# Patient Record
Sex: Female | Born: 1955 | Race: Black or African American | Hispanic: No | Marital: Married | Smoking: Never smoker
Health system: Southern US, Community
[De-identification: ages and names within clinical notes are randomized; demographics above are authoritative.]

## PROBLEM LIST (undated history)

## (undated) DIAGNOSIS — D219 Benign neoplasm of connective and other soft tissue, unspecified: Secondary | ICD-10-CM

## (undated) DIAGNOSIS — J45909 Unspecified asthma, uncomplicated: Secondary | ICD-10-CM

## (undated) DIAGNOSIS — B999 Unspecified infectious disease: Secondary | ICD-10-CM

## (undated) DIAGNOSIS — C801 Malignant (primary) neoplasm, unspecified: Secondary | ICD-10-CM

## (undated) DIAGNOSIS — H9192 Unspecified hearing loss, left ear: Secondary | ICD-10-CM

## (undated) HISTORY — PX: LAPAROTOMY: SHX154

## (undated) HISTORY — PX: ABDOMINAL HYSTERECTOMY: SHX81

---

## 2011-11-19 ENCOUNTER — Encounter (HOSPITAL_COMMUNITY): Payer: Self-pay | Admitting: *Deleted

## 2011-11-19 ENCOUNTER — Inpatient Hospital Stay (HOSPITAL_COMMUNITY)
Admission: AD | Admit: 2011-11-19 | Discharge: 2011-11-19 | Disposition: A | Discharge status: Home / Self Care | Payer: Self-pay | Source: Ambulatory Visit | Attending: Obstetrics & Gynecology | Admitting: Obstetrics & Gynecology

## 2011-11-19 DIAGNOSIS — N644 Mastodynia: Secondary | ICD-10-CM | POA: Insufficient documentation

## 2011-11-19 HISTORY — DX: Unspecified asthma, uncomplicated: J45.909

## 2011-11-19 HISTORY — DX: Unspecified infectious disease: B99.9

## 2011-11-19 HISTORY — DX: Benign neoplasm of connective and other soft tissue, unspecified: D21.9

## 2011-11-19 HISTORY — DX: Unspecified hearing loss, left ear: H91.92

## 2011-11-19 NOTE — MAU Note (Signed)
Pain and tenderness in both breasts, first noted about 3-4 days ago.  Denies any nipple or bleeding from nipples.  'knots/lumps" noted in upper breasts. No prior hx of breast pain or lumps.

## 2011-11-19 NOTE — MAU Note (Signed)
Patient states she has been having pain in both breasts for about one week. Has felt lumps on both breasts last night.

## 2011-11-19 NOTE — MAU Provider Note (Signed)
Attestation of Attending Supervision of Advanced Practitioner (CNM/NP): Evaluation and management procedures were performed by the Advanced Practitioner under my supervision and collaboration.  I have reviewed the Advanced Practitioner's note and chart, and I agree with the management and plan.  UGONNA  ANYANWU, MD, FACOG Attending Obstetrician & Gynecologist Faculty Practice, [facility]  

## 2011-11-19 NOTE — MAU Provider Note (Signed)
  History     CSN: [id number]  Arrival date and time: 11/19/11 1140   First Provider Initiated Contact with Patient 11/19/11 1332      Chief Complaint  Patient presents with  . Breast Pain   HPI Gina Dean is 56 y.o. G1P1001 presents with increasing bilateral breast tenderness that began suddenly 3-4 days ago.   ALso has noticed lumps since tenderness began.  Denies nipple discharge or bleeding.  She does report having planted a garden Had mammogram last year in [place] that was "normal".   LMP 1996-after total hysterectomy for fibroids.  Denies new medications.  Patient does not have a doctor.      Past Medical History  Diagnosis Date  . Fibroid   . Infection   . Asthma   . 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 Abdominal hysterectomy   . Laparotomy     for ovaries, 2 separate surgeries    Family History  Problem Relation Age of Onset  . Other Neg Hx     History  Substance Use Topics  . Smoking status: Never Smoker   . Smokeless tobacco: Never Used  . Alcohol Use: No    Allergies: No Known Allergies  Prescriptions prior to admission  Medication Sig Dispense Refill  . albuterol (PROVENTIL) (2.5 MG/3ML) 0.083% nebulizer solution Take 2.5 mg by nebulization every 6 (six) hours as needed.      . benazepril (LOTENSIN) 20 MG tablet Take 20 mg by mouth daily.      Marland Kitchen dextromethorphan-guaiFENesin (MUCINEX DM) 30-600 MG per 12 hr tablet Take 1 tablet by mouth as needed. congestion      . Fluticasone-Salmeterol (ADVAIR DISKUS) 250-50 MCG/DOSE AEPB Inhale 1 puff into the lungs every 12 (twelve) hours.      Marland Kitchen loratadine (CLARITIN) 10 MG tablet Take 10 mg by mouth daily.        Review of Systems  Skin:       + for breast tenderness X 4 days Neg for nipple discharge/leakage   Physical Exam   Blood pressure 127/69, pulse 65, temperature 98.3 F (36.8 C), temperature source Oral, resp. rate 16, SpO2  100.00%.  Physical Exam  Constitutional: She is oriented to person, place, and time. She appears well-developed and well-nourished. No distress.  Respiratory: Right breast exhibits tenderness. Right breast exhibits no inverted nipple, no mass, no nipple discharge and no skin change. Left breast exhibits tenderness. Left breast exhibits no inverted nipple, no mass, no nipple discharge and no skin change. Breasts are symmetrical.  Neurological: She is alert and oriented to person, place, and time.  Skin: Skin is warm and dry.  Psychiatric: She has a normal mood and affect. Her behavior is normal.    MAU Course  Procedures  MDM 13:50  Spoke to [place] at [facility]  She asked that the patient call her today after 3, she is on her way back to the office to schedule appt and mammogram through [facility] program Assessment and Plan  A:  Bilateral breast tenderness      Possibly chest wall strain from planting garden  P:  Patient instructed to call Martie Lee at [facility] at [phone] to schedule follow up appointment       She may use advil or tylenol for discomfort  KEY,EVE M 11/19/2011, 1:33 PM

## 2011-12-01 ENCOUNTER — Encounter (HOSPITAL_COMMUNITY): Payer: Self-pay

## 2011-12-01 ENCOUNTER — Ambulatory Visit (HOSPITAL_COMMUNITY)
Admission: RE | Admit: 2011-12-01 | Discharge: 2011-12-01 | Disposition: A | Discharge status: Home / Self Care | Payer: Self-pay | Source: Ambulatory Visit | Attending: Obstetrics and Gynecology | Admitting: Obstetrics and Gynecology

## 2011-12-01 ENCOUNTER — Other Ambulatory Visit: Payer: Self-pay | Admitting: Obstetrics and Gynecology

## 2011-12-01 DIAGNOSIS — Z1239 Encounter for other screening for malignant neoplasm of breast: Secondary | ICD-10-CM

## 2011-12-01 DIAGNOSIS — N63 Unspecified lump in unspecified breast: Secondary | ICD-10-CM | POA: Insufficient documentation

## 2011-12-01 DIAGNOSIS — N6323 Unspecified lump in the left breast, lower outer quadrant: Secondary | ICD-10-CM

## 2011-12-01 DIAGNOSIS — N644 Mastodynia: Secondary | ICD-10-CM | POA: Insufficient documentation

## 2011-12-01 DIAGNOSIS — N6313 Unspecified lump in the right breast, lower outer quadrant: Secondary | ICD-10-CM

## 2011-12-08 ENCOUNTER — Ambulatory Visit
Admission: RE | Admit: 2011-12-08 | Discharge: 2011-12-08 | Disposition: A | Discharge status: Home / Self Care | Payer: No Typology Code available for payment source | Source: Ambulatory Visit | Attending: Obstetrics and Gynecology | Admitting: Obstetrics and Gynecology

## 2011-12-08 DIAGNOSIS — N63 Unspecified lump in unspecified breast: Secondary | ICD-10-CM

## 2011-12-11 ENCOUNTER — Encounter: Payer: Self-pay | Admitting: Obstetrics & Gynecology

## 2011-12-28 NOTE — Patient Instructions (Signed)
See detailed notes scanned under media. 

## 2011-12-28 NOTE — Progress Notes (Signed)
See detailed notes scanned under media. 

## 2012-01-13 ENCOUNTER — Telehealth (HOSPITAL_COMMUNITY): Payer: Self-pay | Admitting: *Deleted

## 2012-01-13 NOTE — Telephone Encounter (Signed)
Telephoned patient at home # and discussed results of negative diagnostic mammogram. Recommendation is for screening mammogram in one year. Patient voiced understanding.

## 2012-02-17 NOTE — Addendum Note (Signed)
Encounter addended by: Saintclair Halsted, RN on: 02/17/2012  2:37 PM<BR>     Documentation filed: Charges VN

## 2012-11-30 ENCOUNTER — Other Ambulatory Visit: Payer: Self-pay

## 2013-01-09 ENCOUNTER — Other Ambulatory Visit (HOSPITAL_BASED_OUTPATIENT_CLINIC_OR_DEPARTMENT_OTHER): Payer: Self-pay | Admitting: Internal Medicine

## 2013-01-09 DIAGNOSIS — Z1231 Encounter for screening mammogram for malignant neoplasm of breast: Secondary | ICD-10-CM

## 2013-01-11 ENCOUNTER — Ambulatory Visit (HOSPITAL_BASED_OUTPATIENT_CLINIC_OR_DEPARTMENT_OTHER)
Admission: RE | Admit: 2013-01-11 | Discharge: 2013-01-11 | Disposition: A | Discharge status: Home / Self Care | Payer: Medicare Other | Source: Ambulatory Visit | Attending: Internal Medicine | Admitting: Internal Medicine

## 2013-01-11 DIAGNOSIS — Z1231 Encounter for screening mammogram for malignant neoplasm of breast: Secondary | ICD-10-CM | POA: Insufficient documentation

## 2014-01-08 ENCOUNTER — Encounter (HOSPITAL_COMMUNITY): Payer: Self-pay | Admitting: *Deleted

## 2014-01-17 ENCOUNTER — Other Ambulatory Visit (HOSPITAL_BASED_OUTPATIENT_CLINIC_OR_DEPARTMENT_OTHER): Payer: Self-pay | Admitting: Internal Medicine

## 2014-01-17 DIAGNOSIS — Z1231 Encounter for screening mammogram for malignant neoplasm of breast: Secondary | ICD-10-CM

## 2014-01-22 ENCOUNTER — Ambulatory Visit (HOSPITAL_BASED_OUTPATIENT_CLINIC_OR_DEPARTMENT_OTHER)
Admission: RE | Admit: 2014-01-22 | Discharge: 2014-01-22 | Disposition: A | Discharge status: Home / Self Care | Payer: Medicare Other | Source: Ambulatory Visit | Attending: Internal Medicine | Admitting: Internal Medicine

## 2014-01-22 DIAGNOSIS — Z1231 Encounter for screening mammogram for malignant neoplasm of breast: Secondary | ICD-10-CM | POA: Diagnosis not present

## 2014-11-16 ENCOUNTER — Other Ambulatory Visit (HOSPITAL_BASED_OUTPATIENT_CLINIC_OR_DEPARTMENT_OTHER): Payer: Self-pay | Admitting: Internal Medicine

## 2014-11-16 DIAGNOSIS — Z1231 Encounter for screening mammogram for malignant neoplasm of breast: Secondary | ICD-10-CM

## 2015-01-25 ENCOUNTER — Inpatient Hospital Stay (HOSPITAL_BASED_OUTPATIENT_CLINIC_OR_DEPARTMENT_OTHER): Admission: RE | Admit: 2015-01-25 | Payer: Medicare Other | Source: Ambulatory Visit

## 2015-02-25 ENCOUNTER — Ambulatory Visit (HOSPITAL_BASED_OUTPATIENT_CLINIC_OR_DEPARTMENT_OTHER)
Admission: RE | Admit: 2015-02-25 | Discharge: 2015-02-25 | Disposition: A | Discharge status: Home / Self Care | Payer: Medicare Other | Source: Ambulatory Visit | Attending: Internal Medicine | Admitting: Internal Medicine

## 2015-02-25 DIAGNOSIS — Z1231 Encounter for screening mammogram for malignant neoplasm of breast: Secondary | ICD-10-CM | POA: Insufficient documentation

## 2015-07-10 ENCOUNTER — Inpatient Hospital Stay (HOSPITAL_BASED_OUTPATIENT_CLINIC_OR_DEPARTMENT_OTHER)
Admission: EM | Admit: 2015-07-10 | Discharge: 2015-07-14 | DRG: 176 | Disposition: A | Discharge status: Other Acute Inpatient Hospital | Payer: Medicare Other | Attending: Internal Medicine | Admitting: Internal Medicine

## 2015-07-10 ENCOUNTER — Encounter (HOSPITAL_BASED_OUTPATIENT_CLINIC_OR_DEPARTMENT_OTHER): Payer: Self-pay | Admitting: Emergency Medicine

## 2015-07-10 ENCOUNTER — Emergency Department (HOSPITAL_BASED_OUTPATIENT_CLINIC_OR_DEPARTMENT_OTHER): Payer: Medicare Other

## 2015-07-10 DIAGNOSIS — R0602 Shortness of breath: Secondary | ICD-10-CM | POA: Diagnosis not present

## 2015-07-10 DIAGNOSIS — J45909 Unspecified asthma, uncomplicated: Secondary | ICD-10-CM | POA: Diagnosis present

## 2015-07-10 DIAGNOSIS — I2699 Other pulmonary embolism without acute cor pulmonale: Secondary | ICD-10-CM | POA: Diagnosis not present

## 2015-07-10 DIAGNOSIS — Z79899 Other long term (current) drug therapy: Secondary | ICD-10-CM

## 2015-07-10 DIAGNOSIS — H9192 Unspecified hearing loss, left ear: Secondary | ICD-10-CM | POA: Diagnosis present

## 2015-07-10 DIAGNOSIS — D7589 Other specified diseases of blood and blood-forming organs: Secondary | ICD-10-CM | POA: Diagnosis present

## 2015-07-10 DIAGNOSIS — R6510 Systemic inflammatory response syndrome (SIRS) of non-infectious origin without acute organ dysfunction: Secondary | ICD-10-CM | POA: Diagnosis present

## 2015-07-10 DIAGNOSIS — C3490 Malignant neoplasm of unspecified part of unspecified bronchus or lung: Secondary | ICD-10-CM

## 2015-07-10 DIAGNOSIS — E872 Acidosis, unspecified: Secondary | ICD-10-CM

## 2015-07-10 DIAGNOSIS — C7931 Secondary malignant neoplasm of brain: Secondary | ICD-10-CM | POA: Diagnosis present

## 2015-07-10 DIAGNOSIS — Z8701 Personal history of pneumonia (recurrent): Secondary | ICD-10-CM

## 2015-07-10 DIAGNOSIS — K683 Retroperitoneal hematoma: Secondary | ICD-10-CM

## 2015-07-10 DIAGNOSIS — R58 Hemorrhage, not elsewhere classified: Secondary | ICD-10-CM

## 2015-07-10 DIAGNOSIS — Z7951 Long term (current) use of inhaled steroids: Secondary | ICD-10-CM

## 2015-07-10 DIAGNOSIS — I10 Essential (primary) hypertension: Secondary | ICD-10-CM | POA: Diagnosis present

## 2015-07-10 DIAGNOSIS — R000 Tachycardia, unspecified: Secondary | ICD-10-CM | POA: Diagnosis present

## 2015-07-10 DIAGNOSIS — D649 Anemia, unspecified: Secondary | ICD-10-CM | POA: Diagnosis present

## 2015-07-10 HISTORY — DX: Malignant (primary) neoplasm, unspecified: C80.1

## 2015-07-10 LAB — URINALYSIS, ROUTINE W REFLEX MICROSCOPIC
Bilirubin Urine: NEGATIVE
GLUCOSE, UA: NEGATIVE mg/dL
HGB URINE DIPSTICK: NEGATIVE
KETONES UR: NEGATIVE mg/dL
NITRITE: NEGATIVE
PROTEIN: NEGATIVE mg/dL
Specific Gravity, Urine: 1.013 (ref 1.005–1.030)
pH: 6 (ref 5.0–8.0)

## 2015-07-10 LAB — COMPREHENSIVE METABOLIC PANEL
ALK PHOS: 138 U/L — AB (ref 38–126)
ALT: 14 U/L (ref 14–54)
ANION GAP: 13 (ref 5–15)
AST: 24 U/L (ref 15–41)
Albumin: 2.4 g/dL — ABNORMAL LOW (ref 3.5–5.0)
BILIRUBIN TOTAL: 0.9 mg/dL (ref 0.3–1.2)
BUN: 6 mg/dL (ref 6–20)
CALCIUM: 10 mg/dL (ref 8.9–10.3)
CO2: 21 mmol/L — ABNORMAL LOW (ref 22–32)
Chloride: 102 mmol/L (ref 101–111)
Creatinine, Ser: 0.9 mg/dL (ref 0.44–1.00)
GLUCOSE: 158 mg/dL — AB (ref 65–99)
POTASSIUM: 3.2 mmol/L — AB (ref 3.5–5.1)
Sodium: 136 mmol/L (ref 135–145)
Total Protein: 7.5 g/dL (ref 6.5–8.1)

## 2015-07-10 LAB — URINE MICROSCOPIC-ADD ON

## 2015-07-10 LAB — CBC WITH DIFFERENTIAL/PLATELET
BAND NEUTROPHILS: 5 %
BASOS ABS: 0 10*3/uL (ref 0.0–0.1)
BASOS PCT: 0 %
EOS PCT: 11 %
Eosinophils Absolute: 6.3 10*3/uL — ABNORMAL HIGH (ref 0.0–0.7)
HEMATOCRIT: 29.8 % — AB (ref 36.0–46.0)
HEMOGLOBIN: 9.5 g/dL — AB (ref 12.0–15.0)
LYMPHS PCT: 4 %
Lymphs Abs: 2.3 10*3/uL (ref 0.7–4.0)
MCH: 27 pg (ref 26.0–34.0)
MCHC: 31.9 g/dL (ref 30.0–36.0)
MCV: 84.7 fL (ref 78.0–100.0)
MONOS PCT: 5 %
Monocytes Absolute: 2.9 10*3/uL — ABNORMAL HIGH (ref 0.1–1.0)
NEUTROS PCT: 75 %
Neutro Abs: 45.7 10*3/uL — ABNORMAL HIGH (ref 1.7–7.7)
Platelets: 604 10*3/uL — ABNORMAL HIGH (ref 150–400)
RBC: 3.52 MIL/uL — ABNORMAL LOW (ref 3.87–5.11)
RDW: 15.6 % — ABNORMAL HIGH (ref 11.5–15.5)
WBC: 57.2 10*3/uL (ref 4.0–10.5)

## 2015-07-10 LAB — BRAIN NATRIURETIC PEPTIDE: B NATRIURETIC PEPTIDE 5: 40.9 pg/mL (ref 0.0–100.0)

## 2015-07-10 LAB — I-STAT CG4 LACTIC ACID, ED
Lactic Acid, Venous: 5.44 mmol/L (ref 0.5–2.0)
Lactic Acid, Venous: 3.19 mmol/L (ref 0.5–2.0)

## 2015-07-10 LAB — TROPONIN I: Troponin I: <0.03 ng/mL (ref <0.031)

## 2015-07-10 MED ORDER — MORPHINE SULFATE (PF) 4 MG/ML IV SOLN
4.0000 mg | Freq: Once | INTRAVENOUS | Status: AC
  Administered 2015-07-10: 4 mg via INTRAVENOUS
  Filled 2015-07-10: qty 1

## 2015-07-10 MED ORDER — VANCOMYCIN HCL IN DEXTROSE 1-5 GM/200ML-% IV SOLN
1000.0000 mg | Freq: Once | INTRAVENOUS | Status: AC
  Administered 2015-07-10: 1000 mg via INTRAVENOUS
  Filled 2015-07-10: qty 200

## 2015-07-10 MED ORDER — VANCOMYCIN HCL 500 MG IV SOLR
500.0000 mg | Freq: Two times a day (BID) | INTRAVENOUS | Status: DC
  Administered 2015-07-11 – 2015-07-12 (×3): 500 mg via INTRAVENOUS
  Filled 2015-07-10 (×4): qty 500

## 2015-07-10 MED ORDER — IOPAMIDOL (ISOVUE-370) INJECTION 76%
100.0000 mL | Freq: Once | INTRAVENOUS | Status: AC | PRN
  Administered 2015-07-10: 100 mL via INTRAVENOUS

## 2015-07-10 MED ORDER — CEFEPIME HCL 2 G IJ SOLR
INTRAMUSCULAR | Status: AC
  Filled 2015-07-10: qty 2

## 2015-07-10 MED ORDER — ENOXAPARIN SODIUM 60 MG/0.6ML ~~LOC~~ SOLN
1.0000 mg/kg | Freq: Two times a day (BID) | SUBCUTANEOUS | Status: DC
  Administered 2015-07-10 – 2015-07-14 (×8): 55 mg via SUBCUTANEOUS
  Filled 2015-07-10 (×10): qty 0.6

## 2015-07-10 MED ORDER — SODIUM CHLORIDE 0.9 % IV BOLUS (SEPSIS)
1000.0000 mL | Freq: Once | INTRAVENOUS | Status: AC
  Administered 2015-07-10: 1000 mL via INTRAVENOUS

## 2015-07-10 MED ORDER — DEXTROSE 5 % IV SOLN
2.0000 g | Freq: Three times a day (TID) | INTRAVENOUS | Status: DC
  Filled 2015-07-10: qty 2

## 2015-07-10 MED ORDER — SODIUM CHLORIDE 0.9 % IV BOLUS (SEPSIS)
500.0000 mL | Freq: Once | INTRAVENOUS | Status: AC
  Administered 2015-07-10: 500 mL via INTRAVENOUS

## 2015-07-10 MED ORDER — POTASSIUM CHLORIDE CRYS ER 20 MEQ PO TBCR
40.0000 meq | EXTENDED_RELEASE_TABLET | Freq: Once | ORAL | Status: AC
  Administered 2015-07-10: 40 meq via ORAL
  Filled 2015-07-10: qty 2

## 2015-07-10 MED ORDER — GUAIFENESIN-CODEINE 100-10 MG/5ML PO SOLN
5.0000 mL | Freq: Once | ORAL | Status: AC
  Administered 2015-07-10: 5 mL via ORAL
  Filled 2015-07-10: qty 5

## 2015-07-10 MED ORDER — IPRATROPIUM-ALBUTEROL 0.5-2.5 (3) MG/3ML IN SOLN
3.0000 mL | Freq: Once | RESPIRATORY_TRACT | Status: AC
  Administered 2015-07-10: 3 mL via RESPIRATORY_TRACT
  Filled 2015-07-10: qty 3

## 2015-07-10 MED ORDER — SODIUM CHLORIDE 0.9 % IV BOLUS (SEPSIS)
250.0000 mL | Freq: Once | INTRAVENOUS | Status: AC
  Administered 2015-07-10: 250 mL via INTRAVENOUS

## 2015-07-10 MED ORDER — DEXTROSE 5 % IV SOLN
2.0000 g | Freq: Once | INTRAVENOUS | Status: AC
  Administered 2015-07-10: 2 g via INTRAVENOUS

## 2015-07-10 MED ORDER — SODIUM CHLORIDE 0.9 % IV SOLN
Freq: Once | INTRAVENOUS | Status: AC
  Administered 2015-07-10: 23:00:00 via INTRAVENOUS

## 2015-07-10 NOTE — ED Provider Notes (Signed)
CSN: [id number]     Arrival date & time 07/10/15  1631 History   First MD Initiated Contact with Patient 07/10/15 1631     Chief Complaint  Patient presents with  . Shortness of Breath     (Consider location/radiation/quality/duration/timing/severity/associated sxs/prior Treatment) HPI  60 year old female with a history of left-sided lung cancer currently being treated at [facility] presents with shortness of breath and right-sided chest pain. Started yesterday. Has had some cough. Husband relates that she has been having productive sputum which is new. Has never had chest pain on the right side. Shortness of breath feels different than typical bronchitis/asthma flares. No radiation of the pain. No abdominal pain. Denies any urinary symptoms. Tried her inhaler with no relief. Currently on immunotherapy at [facility], last received a dose 2 weeks ago.  Past Medical History  Diagnosis Date  . Fibroid   . Infection   . Asthma   . 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 Cancer [facility])    Past Surgical History  Procedure Laterality Date  . Abdominal hysterectomy    . Laparotomy      for ovaries, 2 separate surgeries   Family History  Problem Relation Age of Onset  . Other Neg Hx    Social History  Substance Use Topics  . Smoking status: Never Smoker   . Smokeless tobacco: Never Used  . Alcohol Use: No   OB History    Gravida Para Term Preterm AB TAB SAB Ectopic Multiple Living   '1 1 1 '$ 0 0 0 0 0 0 1     Review of Systems  Constitutional: Positive for fever.  Respiratory: Positive for cough and shortness of breath.   Cardiovascular: Positive for chest pain.  Gastrointestinal: Negative for abdominal pain.  All other systems reviewed and are negative.     Allergies  Review of patient's allergies indicates no known allergies.  Home Medications   Prior to Admission medications   Medication Sig Start Date End Date Taking? Authorizing Provider  albuterol  (PROVENTIL) (2.5 MG/3ML) 0.083% nebulizer solution Take 2.5 mg by nebulization every 6 (six) hours as needed.    Historical Provider, MD  benazepril (LOTENSIN) 20 MG tablet Take 20 mg by mouth daily.    Historical Provider, MD  dextromethorphan-guaiFENesin (MUCINEX DM) 30-600 MG per 12 hr tablet Take 1 tablet by mouth as needed. congestion    Historical Provider, MD  Fluticasone-Salmeterol (ADVAIR DISKUS) 250-50 MCG/DOSE AEPB Inhale 1 puff into the lungs every 12 (twelve) hours.    Historical Provider, MD  loratadine (CLARITIN) 10 MG tablet Take 10 mg by mouth daily.    Historical Provider, MD   BP 117/93 mmHg  Pulse 126  Temp(Src) 100.3 F (37.9 C) (Rectal)  Resp 30  Wt 126 lb (57.153 kg)  SpO2 100% Physical Exam  Constitutional: She is oriented to person, place, and time. She appears well-developed and well-nourished.  HENT:  Head: Normocephalic and atraumatic.  Right Ear: External ear normal.  Left Ear: External ear normal.  Nose: Nose normal.  Eyes: Right eye exhibits no discharge. Left eye exhibits no discharge.  Cardiovascular: Regular rhythm and normal heart sounds.  Tachycardia present.   Pulmonary/Chest: Tachypnea noted. No respiratory distress. She has decreased breath sounds in the right lower field and the left lower field. She has no wheezes.  Abdominal: Soft. There is no tenderness.  Musculoskeletal: She exhibits edema (Mild nonpitting ankle swelling bilaterally.).  Neurological: She is alert  and oriented to person, place, and time.  Skin: Skin is warm and dry.  Psychiatric: Her mood appears anxious.  Nursing note and vitals reviewed.   ED Course  Procedures (including critical care time) Labs Review Labs Reviewed  COMPREHENSIVE METABOLIC PANEL - Abnormal; Notable for the following:    Potassium 3.2 (*)    CO2 21 (*)    Glucose, Bld 158 (*)    Albumin 2.4 (*)    Alkaline Phosphatase 138 (*)    All other components within normal limits  CBC WITH  DIFFERENTIAL/PLATELET - Abnormal; Notable for the following:    WBC 57.2 (*)    RBC 3.52 (*)    Hemoglobin 9.5 (*)    HCT 29.8 (*)    RDW 15.6 (*)    Platelets 604 (*)    Neutro Abs 45.7 (*)    Monocytes Absolute 2.9 (*)    Eosinophils Absolute 6.3 (*)    All other components within normal limits  URINALYSIS, ROUTINE W REFLEX MICROSCOPIC (NOT AT [facility]) - Abnormal; Notable for the following:    APPearance CLOUDY (*)    Leukocytes, UA SMALL (*)    All other components within normal limits  URINE MICROSCOPIC-ADD ON - Abnormal; Notable for the following:    Squamous Epithelial / LPF 6-30 (*)    Bacteria, UA MANY (*)    Casts HYALINE CASTS (*)    All other components within normal limits  I-STAT CG4 LACTIC ACID, ED - Abnormal; Notable for the following:    Lactic Acid, Venous 5.44 (*)    All other components within normal limits  I-STAT CG4 LACTIC ACID, ED - Abnormal; Notable for the following:    Lactic Acid, Venous 3.19 (*)    All other components within normal limits  CULTURE, BLOOD (ROUTINE X 2)  CULTURE, BLOOD (ROUTINE X 2)  URINE CULTURE  TROPONIN I  BRAIN NATRIURETIC PEPTIDE  PATHOLOGIST SMEAR REVIEW  I-STAT CG4 LACTIC ACID, ED    Imaging Review Ct Angio Chest Pe W/cm &/or Wo Cm  07/10/2015  CLINICAL DATA:  03/08/2014 PET-CT EXAM: CT ANGIOGRAPHY CHEST WITH CONTRAST TECHNIQUE: Multidetector CT imaging of the chest was performed using the standard protocol during bolus administration of intravenous contrast. Multiplanar CT image reconstructions and MIPs were obtained to evaluate the vascular anatomy. CONTRAST:  100 mL Isovue 370 COMPARISON:  02/26/2014 PET-CT FINDINGS: Mediastinum/Lymph Nodes: Small pulmonary emboli are visualized within a central posterior right upper lobe bronchus as well as the distal right lower lobe pulmonary artery and segmental pulmonary arteries. No evidence of thoracic aortic aneurysm or dissection. RV/LV ratio measures 1.0, indicating possible right heart  strain. Mild mediastinal lymphadenopathy is seen within the prevascular space, AP window, right paratracheal and lateral aortic regions, and subcarinal region. Index lymph node in subcarinal region measures 1.3 cm on image 44 series 5. Lungs/Pleura: A large heterogeneously enhancing mass with central necrosis is seen in the left upper lobe which encases the left pulmonary artery and also appears to involve meet the mediastinum. This also abuts the chest wall soft tissues in the left upper lobe and left lung apex. This mass measures 10 x 11 cm, compared to 4.2 x 5.1 cm previously. Mild infiltrate is seen in the posterior right lower lobe peripheral to right lower lobe pulmonary arterial emboli, consistent with pulmonary infarct. Tiny left pleural effusion noted. Upper abdomen: Benign hemangioma is again seen in the right hepatic lobe which is stable as well as partial visualization of a calcified gallstone.  Musculoskeletal: No chest wall mass or suspicious bone lesions identified. Review of the MIP images confirms the above findings. IMPRESSION: Mild acute pulmonary emboli within right upper and lower lobe pulmonary arteries. Right lower lobe pulmonary infarct also noted. CT evidence of right heart strain (RV/LV Ratio = 1.0) can be a sign of submassive (intermediate risk) PE, although the amount of right lung pulmonary embolism in this case does not appear sub massive and the right heart strain could be due to involvement of the left pulmonary artery by the large left upper lobe mass. The presence of right heart strain has been associated with an increased risk of morbidity and mortality. Consider activating Code PE by paging [phone]. 11 cm left upper lobe mass, with mediastinal and hilar involvement, which significant enlarged compared to prior PET-CT in 2015. This mass also abuts the chest wall soft tissues, without obvious direct invasion. Tiny left pleural effusion noted. Mild mediastinal lymphadenopathy,  consistent with metastatic disease. Critical Value/emergent results were called by telephone at the time of interpretation on 07/10/2015 at 6:52 pm to Dr. Sherwood Gambler , who verbally acknowledged these results. Electronically Signed   By: Earle Gell M.D.   On: 07/10/2015 19:01   Dg Chest Portable 1 View  07/10/2015  CLINICAL DATA:  Shortness of breath for 2 days. History of lung cancer with brain metastases. History of asthma. EXAM: PORTABLE CHEST 1 VIEW COMPARISON:  04/17/2015 FINDINGS: Suspected increase in size of known consolidative heterogeneous mass within in the left upper lung, now with obscuration of the aortic arch and superior aspect the left hilum. No definitive osseous erosion. Otherwise unchanged cardiac silhouette and mediastinal contours. The right lung remains well aerated. No pleural effusion or pneumothorax. No evidence of edema. No acute osseus abnormalities. IMPRESSION: Increase in size of known consolidative/ heterogeneous mass within the left upper lung worrisome for progression of disease. Electronically Signed   By: Sandi Mariscal M.D.   On: 07/10/2015 17:19   I have personally reviewed and evaluated these images and lab results as part of my medical decision-making.   EKG Interpretation   Date/Time:  Wednesday Jul 10 2015 16:38:30 EDT Ventricular Rate:  125 PR Interval:  141 QRS Duration: 76 QT Interval:  310 QTC Calculation: 447 R Axis:   57 Text Interpretation:  Sinus tachycardia Borderline repolarization  abnormality No old tracing to compare Confirmed by Raymon Schlarb MD, [place]  [phone]) on 07/10/2015 5:04:02 PM      CRITICAL CARE Performed by: Sherwood Gambler T   Total critical care time: 35 minutes  Critical care time was exclusive of separately billable procedures and treating other patients.  Critical care was necessary to treat or prevent imminent or life-threatening deterioration.  Critical care was time spent personally by me on the following activities:  development of treatment plan with patient and/or surrogate as well as nursing, discussions with consultants, evaluation of patient's response to treatment, examination of patient, obtaining history from patient or surrogate, ordering and performing treatments and interventions, ordering and review of laboratory studies, ordering and review of radiographic studies, pulse oximetry and re-evaluation of patient's condition.  MDM   Final diagnoses:  Pulmonary infarct ([place])  Other acute pulmonary embolism without acute cor pulmonale (HCC)  Lactic acidosis    Patient's workup shows right-sided pulmonary emboli as well as a small pulmonary infarct. This is likely what is causing her pain as it is in her right lower lobe. Her work of breathing and tachycardia have significantly improved with pain control.  Now she has no pain. Is still having some cough. Her lactic acid is 5, she was initially treated per sepsis protocol given low-grade fever, tachycardia, and lactic acidosis. However I think the majority of her symptoms are now coming from the PE. No hypotension. Occasionally has blood pressure in the 90s but is not symptomatic and most of her blood pressure in the low 100s. I discussed with her oncologist, Dr. Cleaster Corin at [facility]. He wants to have the patient admitted there but they have no beds and are unlikely to get a bed until tomorrow at the very earliest. Due to this will admit the patient to [facility]. After discussion with Dr. Fatima Sanger, he wants me to treat with Lovenox twice per day. 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 Dr. Fatima Sanger is aware of this and feels that Lovenox is safe. No other history of bleeding. Dr. Hal Hope accepts as an admission and transfer to [facility]. Subsequent lactic acid is lower, we'll continue fluids.    Sherwood Gambler, MD 07/10/15 [phone]

## 2015-07-10 NOTE — Plan of Care (Signed)
60 year old female with history of metastatic lung cancer, hypertension who gets most of her care from [facility] presented with shortness of breath. Patient was found to be tachycardic and mildly hypotensive on presentation. Patient also was mildly febrile. Initially septic workup was started and patient was given fluid bolus and patient's blood pressure improved. CT angiogram of the chest shows small pulmonary embolism for which ER physician Dr. Regenia Skeeter had discussed with patient's oncologist at [facility] and patient was started on Lovenox. Initial plan was to transfer to [facility] but since there was no room available patient will be admitted to [facility] long hospital stepdown unit. As per the ER physician patient blood pressure and heart rate has stabilized after fluids and has been placed on empiric antibiotics and Lovenox now. Lactic acid is improving and patient as per ER physician is stable at this time. ER physician felt that patient would not require thrombolytics at this time.  Gean Birchwood.

## 2015-07-10 NOTE — ED Notes (Addendum)
Pt in c/o SOB x 2 days. Currently has lung cancer with mets to brain. O2 sat 100% on RA.

## 2015-07-10 NOTE — Progress Notes (Signed)
ANTICOAGULATION CONSULT NOTE - Initial Consult  Pharmacy Consult for enoxaparin Indication: pulmonary embolus  No Known Allergies  Patient Measurements: Height: '5\' 4"'$  (162.6 cm) Weight: 126 lb (57.153 kg) IBW/kg (Calculated) : 54.7  Vital Signs: Temp: 100.3 F (37.9 C) (05/03 1659) Temp Source: Rectal (05/03 1659) BP: 104/68 mmHg (05/03 1915) Pulse Rate: 101 (05/03 1915)  Labs:  Recent Labs  07/10/15 1635  HGB 9.5*  HCT 29.8*  PLT 604*  CREATININE 0.90  TROPONINI <0.03    Estimated Creatinine Clearance: 56.7 mL/min (by C-G formula based on Cr of 0.9).  Assessment: 60 yo female presenting with SOB x 2 days  PMH: lung ca with mets  AC: none PTA. Now enoxaparin for PE. CTA acute PE with RH strain  ID: abx for PNA. WBC 57.2, Tmax 100.3, Lactate 5.44  Cefepime 5/3>> Vancomycin 5/3>>  5/3 Blood x 2 5/3 Urine>>  Renal SCr 0.9  Heme: H&H 9.5/29.8, Plt 604  Plan:  Lovenox 1 mg/kg q12h Monitor for s/sx of bleeding  Levester Fresh, PharmD, BCPS, [facility] Clinical Pharmacist Pager [phone] 07/10/2015 7:37 PM

## 2015-07-10 NOTE — Progress Notes (Signed)
Pharmacy Antibiotic Note  Gina Dean is a 60 y.o. female admitted on 07/10/2015 with sepsis.  Pharmacy has been consulted for cefepime and vancomycin dosing.  Plan: Vancomycin 1000 mg x 1 then 500 mg IV every 12 hours.  Goal trough 15-20 mcg/mL. Cefepime 2 g q8h  Height: '5\' 4"'$  (162.6 cm) Weight: 126 lb (57.153 kg) IBW/kg (Calculated) : 54.7  Temp (24hrs), Avg:100 F (37.8 C), Min:99.6 F (37.6 C), Max:100.3 F (37.9 C)   Recent Labs Lab 07/10/15 1635 07/10/15 1653  WBC 57.2*  --   CREATININE 0.90  --   LATICACIDVEN  --  5.44*    Estimated Creatinine Clearance: 56.7 mL/min (by C-G formula based on Cr of 0.9).    No Known Allergies  Antimicrobials this admission: Cefepime 5/3>> Vancomycin 5/3>>  Dose adjustments this admission: None  Microbiology results: 5/3 Blood x 2 5/3 Urine>>  Levester Fresh, PharmD, BCPS, [facility] Clinical Pharmacist Pager [phone] 07/10/2015 7:17 PM

## 2015-07-11 ENCOUNTER — Inpatient Hospital Stay (HOSPITAL_COMMUNITY): Payer: Medicare Other

## 2015-07-11 ENCOUNTER — Encounter (HOSPITAL_COMMUNITY): Payer: Self-pay | Admitting: Internal Medicine

## 2015-07-11 DIAGNOSIS — D72829 Elevated white blood cell count, unspecified: Secondary | ICD-10-CM | POA: Diagnosis not present

## 2015-07-11 DIAGNOSIS — D649 Anemia, unspecified: Secondary | ICD-10-CM | POA: Diagnosis present

## 2015-07-11 DIAGNOSIS — R6510 Systemic inflammatory response syndrome (SIRS) of non-infectious origin without acute organ dysfunction: Secondary | ICD-10-CM

## 2015-07-11 DIAGNOSIS — I2699 Other pulmonary embolism without acute cor pulmonale: Secondary | ICD-10-CM

## 2015-07-11 DIAGNOSIS — Z8701 Personal history of pneumonia (recurrent): Secondary | ICD-10-CM | POA: Diagnosis not present

## 2015-07-11 DIAGNOSIS — Z7951 Long term (current) use of inhaled steroids: Secondary | ICD-10-CM | POA: Diagnosis not present

## 2015-07-11 DIAGNOSIS — I10 Essential (primary) hypertension: Secondary | ICD-10-CM | POA: Diagnosis present

## 2015-07-11 DIAGNOSIS — Z79899 Other long term (current) drug therapy: Secondary | ICD-10-CM | POA: Diagnosis not present

## 2015-07-11 DIAGNOSIS — C3490 Malignant neoplasm of unspecified part of unspecified bronchus or lung: Secondary | ICD-10-CM | POA: Diagnosis not present

## 2015-07-11 DIAGNOSIS — H9192 Unspecified hearing loss, left ear: Secondary | ICD-10-CM | POA: Diagnosis present

## 2015-07-11 DIAGNOSIS — R000 Tachycardia, unspecified: Secondary | ICD-10-CM | POA: Diagnosis present

## 2015-07-11 DIAGNOSIS — J45909 Unspecified asthma, uncomplicated: Secondary | ICD-10-CM | POA: Diagnosis present

## 2015-07-11 DIAGNOSIS — C7931 Secondary malignant neoplasm of brain: Secondary | ICD-10-CM | POA: Diagnosis present

## 2015-07-11 DIAGNOSIS — R0602 Shortness of breath: Secondary | ICD-10-CM | POA: Diagnosis present

## 2015-07-11 DIAGNOSIS — D7589 Other specified diseases of blood and blood-forming organs: Secondary | ICD-10-CM | POA: Diagnosis present

## 2015-07-11 DIAGNOSIS — R509 Fever, unspecified: Secondary | ICD-10-CM | POA: Diagnosis not present

## 2015-07-11 LAB — COMPREHENSIVE METABOLIC PANEL
ALBUMIN: 2 g/dL — AB (ref 3.5–5.0)
ALT: 12 U/L — AB (ref 14–54)
AST: 8 U/L — AB (ref 15–41)
Alkaline Phosphatase: 104 U/L (ref 38–126)
Anion gap: 7 (ref 5–15)
BILIRUBIN TOTAL: 0.6 mg/dL (ref 0.3–1.2)
CO2: 20 mmol/L — ABNORMAL LOW (ref 22–32)
CREATININE: 0.64 mg/dL (ref 0.44–1.00)
Calcium: 8.6 mg/dL — ABNORMAL LOW (ref 8.9–10.3)
Chloride: 113 mmol/L — ABNORMAL HIGH (ref 101–111)
GFR calc Af Amer: >60 mL/min (ref >60)
GLUCOSE: 112 mg/dL — AB (ref 65–99)
Potassium: 3.4 mmol/L — ABNORMAL LOW (ref 3.5–5.1)
Sodium: 140 mmol/L (ref 135–145)
TOTAL PROTEIN: 6.3 g/dL — AB (ref 6.5–8.1)

## 2015-07-11 LAB — CBC WITH DIFFERENTIAL/PLATELET
BASOS PCT: 0 %
Band Neutrophils: 0 %
Basophils Absolute: 0 10*3/uL (ref 0.0–0.1)
Blasts: 0 %
EOS PCT: 4 %
Eosinophils Absolute: 2.1 10*3/uL — ABNORMAL HIGH (ref 0.0–0.7)
HEMATOCRIT: 23.2 % — AB (ref 36.0–46.0)
Hemoglobin: 7.3 g/dL — ABNORMAL LOW (ref 12.0–15.0)
LYMPHS PCT: 4 %
Lymphs Abs: 2.1 10*3/uL (ref 0.7–4.0)
MCH: 26.3 pg (ref 26.0–34.0)
MCHC: 31.5 g/dL (ref 30.0–36.0)
MCV: 83.5 fL (ref 78.0–100.0)
MONO ABS: 4.2 10*3/uL — AB (ref 0.1–1.0)
MYELOCYTES: 0 %
Metamyelocytes Relative: 0 %
Monocytes Relative: 8 %
NEUTROS PCT: 84 %
NRBC: 0 /100{WBCs}
Neutro Abs: 44.6 10*3/uL — ABNORMAL HIGH (ref 1.7–7.7)
OTHER: 0 %
PROMYELOCYTES ABS: 0 %
Platelets: 500 10*3/uL — ABNORMAL HIGH (ref 150–400)
RBC: 2.78 MIL/uL — AB (ref 3.87–5.11)
RDW: 15.8 % — ABNORMAL HIGH (ref 11.5–15.5)
WBC: 53 10*3/uL — AB (ref 4.0–10.5)

## 2015-07-11 LAB — PROCALCITONIN: Procalcitonin: 1.06 ng/mL

## 2015-07-11 LAB — TROPONIN I: Troponin I: <0.03 ng/mL (ref <0.031)

## 2015-07-11 LAB — ABO/RH: ABO/RH(D): A POS

## 2015-07-11 LAB — ECHOCARDIOGRAM COMPLETE
Height: 64 in
Weight: 2016 oz

## 2015-07-11 LAB — LACTIC ACID, PLASMA
LACTIC ACID, VENOUS: 2 mmol/L (ref 0.5–2.0)
Lactic Acid, Venous: 1.6 mmol/L (ref 0.5–2.0)

## 2015-07-11 LAB — PATHOLOGIST SMEAR REVIEW

## 2015-07-11 LAB — MRSA PCR SCREENING: MRSA BY PCR: NEGATIVE

## 2015-07-11 MED ORDER — ACETAMINOPHEN 650 MG RE SUPP: 650.0000 mg | Freq: Four times a day (QID) | RECTAL | Status: DC | PRN

## 2015-07-11 MED ORDER — PIPERACILLIN-TAZOBACTAM 3.375 G IVPB
3.3750 g | Freq: Three times a day (TID) | INTRAVENOUS | Status: DC
  Administered 2015-07-11 – 2015-07-13 (×8): 3.375 g via INTRAVENOUS
  Filled 2015-07-11 (×8): qty 50

## 2015-07-11 MED ORDER — VANCOMYCIN HCL IN DEXTROSE 1-5 GM/200ML-% IV SOLN: 1000.0000 mg | Freq: Once | INTRAVENOUS | Status: DC

## 2015-07-11 MED ORDER — DM-GUAIFENESIN ER 30-600 MG PO TB12
1.0000 | ORAL_TABLET | ORAL | Status: DC | PRN
  Administered 2015-07-11 – 2015-07-14 (×4): 1 via ORAL
  Filled 2015-07-11 (×4): qty 1

## 2015-07-11 MED ORDER — MOMETASONE FURO-FORMOTEROL FUM 200-5 MCG/ACT IN AERO
2.0000 | INHALATION_SPRAY | Freq: Two times a day (BID) | RESPIRATORY_TRACT | Status: DC
  Administered 2015-07-11 – 2015-07-14 (×7): 2 via RESPIRATORY_TRACT
  Filled 2015-07-11: qty 8.8

## 2015-07-11 MED ORDER — ENOXAPARIN (LOVENOX) PATIENT EDUCATION KIT
PACK | Freq: Once | Status: AC
  Administered 2015-07-11: 13:00:00
  Filled 2015-07-11: qty 1

## 2015-07-11 MED ORDER — SODIUM CHLORIDE 0.9 % IV SOLN
INTRAVENOUS | Status: DC
  Administered 2015-07-11: 18:00:00 via INTRAVENOUS

## 2015-07-11 MED ORDER — ONDANSETRON HCL 4 MG/2ML IJ SOLN: 4.0000 mg | Freq: Four times a day (QID) | INTRAMUSCULAR | Status: DC | PRN

## 2015-07-11 MED ORDER — POTASSIUM CHLORIDE CRYS ER 20 MEQ PO TBCR
20.0000 meq | EXTENDED_RELEASE_TABLET | Freq: Once | ORAL | Status: AC
  Administered 2015-07-11: 20 meq via ORAL
  Filled 2015-07-11: qty 1

## 2015-07-11 MED ORDER — PIPERACILLIN-TAZOBACTAM 3.375 G IVPB 30 MIN: 3.3750 g | Freq: Once | INTRAVENOUS | Status: DC

## 2015-07-11 MED ORDER — ONDANSETRON HCL 4 MG PO TABS: 4.0000 mg | ORAL_TABLET | Freq: Four times a day (QID) | ORAL | Status: DC | PRN

## 2015-07-11 MED ORDER — ACETAMINOPHEN 325 MG PO TABS
650.0000 mg | ORAL_TABLET | Freq: Four times a day (QID) | ORAL | Status: DC | PRN
  Administered 2015-07-11 – 2015-07-14 (×4): 650 mg via ORAL
  Filled 2015-07-11 (×4): qty 2

## 2015-07-11 MED ORDER — ALBUTEROL SULFATE (2.5 MG/3ML) 0.083% IN NEBU
2.5000 mg | INHALATION_SOLUTION | RESPIRATORY_TRACT | Status: DC | PRN
  Administered 2015-07-11 – 2015-07-13 (×2): 2.5 mg via RESPIRATORY_TRACT
  Filled 2015-07-11 (×2): qty 3

## 2015-07-11 MED ORDER — LORATADINE 10 MG PO TABS
10.0000 mg | ORAL_TABLET | Freq: Every day | ORAL | Status: DC
  Administered 2015-07-11 – 2015-07-14 (×4): 10 mg via ORAL
  Filled 2015-07-11 (×4): qty 1

## 2015-07-11 NOTE — Care Management Note (Signed)
Case Management Note  Patient Details  Name: Gina Dean MRN: [id number] Date of Birth: Oct 16, 1955  Subjective/Objective:       Sepsis with hx of lung ca             Action/Plan:Date:  Jul 11, 2015 Chart reviewed for concurrent status and case management needs. Will continue to follow patient for changes and needs: Velva Harman, BSN, [place], [place]   Expected Discharge Date:                  Expected Discharge Plan:  Home/Self Care  In-House Referral:  NA  Discharge planning Services  CM Consult  Post Acute Care Choice:  NA Choice offered to:  NA  DME Arranged:  N/A DME Agency:  NA  HH Arranged:  NA HH Agency:  NA  Status of Service:  In process, will continue to follow  Medicare Important Message Given:    Date Medicare IM Given:    Medicare IM give by:    Date Additional Medicare IM Given:    Additional Medicare Important Message give by:     If discussed at [place] of Stay Meetings, dates discussed:    Additional Comments:  Leeroy Cha, RN 07/11/2015, 1:02 PM

## 2015-07-11 NOTE — H&P (Addendum)
History and Physical    Gina Dean DGU:[id number] DOB: 12/04/1955 DOA: 07/10/2015  Referring MD/NP/PA: Patient was transferred from [facility] [place]. PCP: Idamae Schuller, MD  Outpatient Specialists: Dr. Cleaster Corin. Oncologist. Patient coming from: Home.  Chief Complaint: Shortness of breath.  HPI: Gina Dean is a 60 y.o. female with medical history significant of metastatic lung cancer on chemotherapy, hypertension to see all medications, asthma presents with ER because of shortness of breath. Patient has been having shortness of breath over the last 2 days on exertion and sometimes at rest. Denies any chest pain. Patient has been having chronic cough and was admitted last month at [facility] for postobstructive pneumonia. In the ER patient initially was found to be hypotensive tachycardic and lactic as it was elevated and was given empiric antibiotics for sepsis after blood cultures were sent. CT angiogram of the chest done showed 2 areas of pulmonary embolism. Since patient gets most of her care at [facility] ER physician and discuss with patient's oncologist by the time is advised patient to be placed on Lovenox and since there was no bed available patient was admitted at [facility] long hospital. Patient's blood pressure and tachycardia has significantly improved after fluid bolus. On exam patient is able to talk without any difficulty. Patient has been still having productive cough. Denies any nausea vomiting abdominal pain and diarrhea.   ED Course: Patient was given fluid bolus antibiotics and Lovenox for possible sepsis and PE.  Review of Systems: As per HPI otherwise 10 point review of systems negative.    Past Medical History  Diagnosis Date  . Fibroid   . Infection   . Asthma   . 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 Cancer [facility])     Past Surgical History  Procedure Laterality Date  . Abdominal hysterectomy    . Laparotomy      for  ovaries, 2 separate surgeries     reports that she has never smoked. She has never used smokeless tobacco. She reports that she does not drink alcohol or use illicit drugs.  No Known Allergies  Family History  Problem Relation Age of Onset  . Other Neg Hx   . Diabetes Mellitus II Neg Hx   . Hypertension Neg Hx     Prior to Admission medications   Medication Sig Start Date End Date Taking? Authorizing Provider  albuterol (PROVENTIL) (2.5 MG/3ML) 0.083% nebulizer solution Take 2.5 mg by nebulization every 6 (six) hours as needed.    Historical Provider, MD  benazepril (LOTENSIN) 20 MG tablet Take 20 mg by mouth daily.    Historical Provider, MD  dextromethorphan-guaiFENesin (MUCINEX DM) 30-600 MG per 12 hr tablet Take 1 tablet by mouth as needed. congestion    Historical Provider, MD  Fluticasone-Salmeterol (ADVAIR DISKUS) 250-50 MCG/DOSE AEPB Inhale 1 puff into the lungs every 12 (twelve) hours.    Historical Provider, MD  loratadine (CLARITIN) 10 MG tablet Take 10 mg by mouth daily.    Historical Provider, MD    Physical Exam: Filed Vitals:   07/10/15 2300 07/10/15 2330 07/11/15 0035 07/11/15 0057  BP: 107/69 100/68 107/49   Pulse: 100 101 94   Temp:  98.9 F (37.2 C)  99 F (37.2 C)  TempSrc:  Oral Oral Oral  Resp: '27 24 27   '$ Height:      Weight:      SpO2: 97% 97% 98%  Constitutional: Appears normal. Filed Vitals:   07/10/15 2300 07/10/15 2330 07/11/15 0035 07/11/15 0057  BP: 107/69 100/68 107/49   Pulse: 100 101 94   Temp:  98.9 F (37.2 C)  99 F (37.2 C)  TempSrc:  Oral Oral Oral  Resp: '27 24 27   '$ Height:      Weight:      SpO2: 97% 97% 98%    Eyes: Anicteric no pallor. ENMT: No discharge from the ears eyes nose and mouth. Neck: No mass felt. No JVD appreciated. Respiratory: No rhonchi or crepitations. Has coarse bilateral breath sounds. Cardiovascular: S1-S2 heard. Abdomen: Soft nontender bowel sounds present. Musculoskeletal: No edema. Skin: No  rash. Neurologic: Alert awake oriented to time place and person. Moving all extremities. Psychiatric: Appears normal.   Labs on Admission: I have personally reviewed following labs and imaging studies  CBC:  Recent Labs Lab 07/10/15 1635  WBC 57.2*  NEUTROABS 45.7*  HGB 9.5*  HCT 29.8*  MCV 84.7  PLT 867*   Basic Metabolic Panel:  Recent Labs Lab 07/10/15 1635  NA 136  K 3.2*  CL 102  CO2 21*  GLUCOSE 158*  BUN 6  CREATININE 0.90  CALCIUM 10.0   GFR: Estimated Creatinine Clearance: 56.7 mL/min (by C-G formula based on Cr of 0.9). Liver Function Tests:  Recent Labs Lab 07/10/15 1635  AST 24  ALT 14  ALKPHOS 138*  BILITOT 0.9  PROT 7.5  ALBUMIN 2.4*   No results for input(s): LIPASE, AMYLASE in the last 168 hours. No results for input(s): AMMONIA in the last 168 hours. Coagulation Profile: No results for input(s): INR, PROTIME in the last 168 hours. Cardiac Enzymes:  Recent Labs Lab 07/10/15 1635  TROPONINI <0.03   BNP (last 3 results) No results for input(s): PROBNP in the last 8760 hours. HbA1C: No results for input(s): HGBA1C in the last 72 hours. CBG: No results for input(s): GLUCAP in the last 168 hours. Lipid Profile: No results for input(s): CHOL, HDL, LDLCALC, TRIG, CHOLHDL, LDLDIRECT in the last 72 hours. Thyroid Function Tests: No results for input(s): TSH, T4TOTAL, FREET4, T3FREE, THYROIDAB in the last 72 hours. Anemia Panel: No results for input(s): VITAMINB12, FOLATE, FERRITIN, TIBC, IRON, RETICCTPCT in the last 72 hours. Urine analysis:    Component Value Date/Time   COLORURINE YELLOW 07/10/2015 1753   APPEARANCEUR CLOUDY* 07/10/2015 1753   LABSPEC 1.013 07/10/2015 1753   PHURINE 6.0 07/10/2015 1753   GLUCOSEU NEGATIVE 07/10/2015 1753   HGBUR NEGATIVE 07/10/2015 1753   BILIRUBINUR NEGATIVE 07/10/2015 1753   KETONESUR NEGATIVE 07/10/2015 1753   PROTEINUR NEGATIVE 07/10/2015 1753   NITRITE NEGATIVE 07/10/2015 1753    LEUKOCYTESUR SMALL* 07/10/2015 1753   Sepsis Labs: '@LABRCNTIP'$ (procalcitonin:4,lacticidven:4) )No results found for this or any previous visit (from the past 240 hour(s)).   Radiological Exams on Admission: Ct Angio Chest Pe W/cm &/or Wo Cm  07/10/2015  CLINICAL DATA:  03/08/2014 PET-CT EXAM: CT ANGIOGRAPHY CHEST WITH CONTRAST TECHNIQUE: Multidetector CT imaging of the chest was performed using the standard protocol during bolus administration of intravenous contrast. Multiplanar CT image reconstructions and MIPs were obtained to evaluate the vascular anatomy. CONTRAST:  100 mL Isovue 370 COMPARISON:  02/26/2014 PET-CT FINDINGS: Mediastinum/Lymph Nodes: Small pulmonary emboli are visualized within a central posterior right upper lobe bronchus as well as the distal right lower lobe pulmonary artery and segmental pulmonary arteries. No evidence of thoracic aortic aneurysm or dissection. RV/LV ratio measures 1.0, indicating possible right heart strain. Mild mediastinal  lymphadenopathy is seen within the prevascular space, AP window, right paratracheal and lateral aortic regions, and subcarinal region. Index lymph node in subcarinal region measures 1.3 cm on image 44 series 5. Lungs/Pleura: A large heterogeneously enhancing mass with central necrosis is seen in the left upper lobe which encases the left pulmonary artery and also appears to involve meet the mediastinum. This also abuts the chest wall soft tissues in the left upper lobe and left lung apex. This mass measures 10 x 11 cm, compared to 4.2 x 5.1 cm previously. Mild infiltrate is seen in the posterior right lower lobe peripheral to right lower lobe pulmonary arterial emboli, consistent with pulmonary infarct. Tiny left pleural effusion noted. Upper abdomen: Benign hemangioma is again seen in the right hepatic lobe which is stable as well as partial visualization of a calcified gallstone. Musculoskeletal: No chest wall mass or suspicious bone lesions  identified. Review of the MIP images confirms the above findings. IMPRESSION: Mild acute pulmonary emboli within right upper and lower lobe pulmonary arteries. Right lower lobe pulmonary infarct also noted. CT evidence of right heart strain (RV/LV Ratio = 1.0) can be a sign of submassive (intermediate risk) PE, although the amount of right lung pulmonary embolism in this case does not appear sub massive and the right heart strain could be due to involvement of the left pulmonary artery by the large left upper lobe mass. The presence of right heart strain has been associated with an increased risk of morbidity and mortality. Consider activating Code PE by paging [phone]. 11 cm left upper lobe mass, with mediastinal and hilar involvement, which significant enlarged compared to prior PET-CT in 2015. This mass also abuts the chest wall soft tissues, without obvious direct invasion. Tiny left pleural effusion noted. Mild mediastinal lymphadenopathy, consistent with metastatic disease. Critical Value/emergent results were called by telephone at the time of interpretation on 07/10/2015 at 6:52 pm to Dr. Sherwood Gambler , who verbally acknowledged these results. Electronically Signed   By: Earle Gell M.D.   On: 07/10/2015 19:01   Dg Chest Portable 1 View  07/10/2015  CLINICAL DATA:  Shortness of breath for 2 days. History of lung cancer with brain metastases. History of asthma. EXAM: PORTABLE CHEST 1 VIEW COMPARISON:  04/17/2015 FINDINGS: Suspected increase in size of known consolidative heterogeneous mass within in the left upper lung, now with obscuration of the aortic arch and superior aspect the left hilum. No definitive osseous erosion. Otherwise unchanged cardiac silhouette and mediastinal contours. The right lung remains well aerated. No pleural effusion or pneumothorax. No evidence of edema. No acute osseus abnormalities. IMPRESSION: Increase in size of known consolidative/ heterogeneous mass within the left  upper lung worrisome for progression of disease. Electronically Signed   By: Sandi Mariscal M.D.   On: 07/10/2015 17:19    EKG: Independently reviewed. Sinus tachycardia.  Assessment/Plan Principal Problem:   Pulmonary embolism without acute cor pulmonale (HCC) Active Problems:   SIRS (systemic inflammatory response syndrome) (HCC)   Chronic anemia   PE (pulmonary embolism)    #1. Pulmonary embolism - ER physician had discussed with patient's oncologist at [facility] who has advised patient to be placed on Lovenox which will be continued. Patient presently is hemodynamically stable. We'll cycle cardiac markers and check 2-D echo. #2. SIRS - source not clear. Suspect could be respiratory given patient's productive cough. Patient was admitted last month for postobstructive pneumonia but CT chest does not show any definite evidence of pneumonia at this time. At  this time patient is empirically placed on vancomycin and Zosyn continue with hydration and follow lactic acid levels procalcitonin levels and blood cultures. #3. Leukocytosis and thrombocytosis - this has been persistently elevated after reviewing patient's labs and care everywhere. For now we are treating empirically on antibiotics secondary to #2. #4. Chronic anemia - follow CBC. #5. History of hypertension - patient states she has not been taking antihypertensives for more than a year now since her blood pressure has been running low. #6. History of asthma - presently not wheezing continue home inhalers.  Patient gets all her [facility]. At the time of transfer there was no beds available at [facility]. May discuss with transfer center in a.m.   DVT prophylaxis: Lovenox. Code Status: Full code.  Family Communication: Patient's husband.  Disposition Plan: Home.  Consults called: ER physician has discussed with patient's oncologist.  Admission status: Observation.   Rise Patience MD [facility] Pager  [phone].  If 7PM-7AM, please contact night-coverage [email or web address] Password [email or web address]  07/11/2015, 1:47 AM

## 2015-07-11 NOTE — ED Notes (Signed)
Attempted x2 to call report to the floor on this pt but no one answered the phone.  Report was given to [place] prior to departure.

## 2015-07-11 NOTE — Progress Notes (Signed)
Pharmacy Antibiotic Note  Gina Dean is a 60 y.o. female admitted on 07/10/2015 with sepsis.  Patient was seen at [facility] and started on Vancomycin and Cefepime.  Patient transferred to [facility] and upon admission, Vancomycin & Zosyn ordered.  Cefepime per pharmacy consult was d/c'ed.    Plan: Continue Vancomycin '500mg'$  IV q12h as previously ordered.  Goal trough 15-20 mcg/ml Zosyn 3.375gm IV q8h (each dose infused over 4 hrs)  Height: '5\' 4"'$  (162.6 cm) Weight: 126 lb (57.153 kg) IBW/kg (Calculated) : 54.7  Temp (24hrs), Avg:99.5 F (37.5 C), Min:98.9 F (37.2 C), Max:100.3 F (37.9 C)   Recent Labs Lab 07/10/15 1635 07/10/15 1653 07/10/15 2005  WBC 57.2*  --   --   CREATININE 0.90  --   --   LATICACIDVEN  --  5.44* 3.19*    Estimated Creatinine Clearance: 56.7 mL/min (by C-G formula based on Cr of 0.9).    No Known Allergies  Antimicrobials this admission: 5/3 Cefepime >> 5/4 5/3 Vanc >>   5/3 Zosyn >>  Dose adjustments this admission:    Microbiology results: 5/3 BCx:   5/3 UCx:     Thank you for allowing pharmacy to be a part of this patient's care.  Everette Rank, PharmD 07/11/2015 2:02 AM

## 2015-07-11 NOTE — Progress Notes (Signed)
Patient seen and examined, reported feeling better, vital stable, NAD, need lovenox teaching, echo and venous doppler of lower extremity pending, husband updated in room, stable to transfer to med tele.

## 2015-07-11 NOTE — Progress Notes (Signed)
VASCULAR LAB PRELIMINARY  PRELIMINARY  PRELIMINARY  PRELIMINARY  Bilateral lower extremity venous duplex completed.      Bilateral:  No evidence of DVT, superficial thrombosis, or Baker's Cyst.   Janifer Adie, RVT, RDMS 07/11/2015, 3:52 PM

## 2015-07-11 NOTE — Progress Notes (Signed)
Echocardiogram 2D Echocardiogram has been performed.  Tresa Res 07/11/2015, 4:02 PM

## 2015-07-12 DIAGNOSIS — D72829 Elevated white blood cell count, unspecified: Secondary | ICD-10-CM

## 2015-07-12 LAB — BASIC METABOLIC PANEL
ANION GAP: 8 (ref 5–15)
BUN: <5 mg/dL — ABNORMAL LOW (ref 6–20)
CALCIUM: 8.7 mg/dL — AB (ref 8.9–10.3)
CO2: 16 mmol/L — AB (ref 22–32)
Chloride: 116 mmol/L — ABNORMAL HIGH (ref 101–111)
Creatinine, Ser: 0.6 mg/dL (ref 0.44–1.00)
GFR calc Af Amer: >60 mL/min (ref >60)
GLUCOSE: 79 mg/dL (ref 65–99)
POTASSIUM: 3.5 mmol/L (ref 3.5–5.1)
Sodium: 140 mmol/L (ref 135–145)

## 2015-07-12 LAB — URINE CULTURE

## 2015-07-12 LAB — CBC
HEMATOCRIT: 24.5 % — AB (ref 36.0–46.0)
Hemoglobin: 7.5 g/dL — ABNORMAL LOW (ref 12.0–15.0)
MCH: 26.5 pg (ref 26.0–34.0)
MCHC: 30.6 g/dL (ref 30.0–36.0)
MCV: 86.6 fL (ref 78.0–100.0)
Platelets: 492 10*3/uL — ABNORMAL HIGH (ref 150–400)
RBC: 2.83 MIL/uL — AB (ref 3.87–5.11)
RDW: 16.1 % — ABNORMAL HIGH (ref 11.5–15.5)
WBC: 50.6 10*3/uL — AB (ref 4.0–10.5)

## 2015-07-12 MED ORDER — GUAIFENESIN ER 600 MG PO TB12
600.0000 mg | ORAL_TABLET | Freq: Two times a day (BID) | ORAL | Status: DC
  Administered 2015-07-12 – 2015-07-14 (×5): 600 mg via ORAL
  Filled 2015-07-12 (×5): qty 1

## 2015-07-12 MED ORDER — ENOXAPARIN SODIUM 150 MG/ML ~~LOC~~ SOLN: 1.0000 mg/kg | Freq: Two times a day (BID) | SUBCUTANEOUS | Status: AC

## 2015-07-12 MED ORDER — POTASSIUM CHLORIDE CRYS ER 20 MEQ PO TBCR
40.0000 meq | EXTENDED_RELEASE_TABLET | Freq: Once | ORAL | Status: AC
  Administered 2015-07-12: 40 meq via ORAL
  Filled 2015-07-12: qty 2

## 2015-07-12 MED ORDER — ENSURE ENLIVE PO LIQD
237.0000 mL | Freq: Two times a day (BID) | ORAL | Status: DC
  Administered 2015-07-12 – 2015-07-14 (×3): 237 mL via ORAL

## 2015-07-12 NOTE — Progress Notes (Signed)
ANTICOAGULATION CONSULT NOTE - Follow Up Consult  Pharmacy Consult for Lovenox Indication: pulmonary embolus  No Known Allergies  Patient Measurements: Height: '5\' 4"'$  (162.6 cm) Weight: 128 lb 1.4 oz (58.1 kg) IBW/kg (Calculated) : 54.7  Vital Signs: Temp: 100.8 F (38.2 C) (05/05 0547) Temp Source: Oral (05/05 0547) BP: 112/69 mmHg (05/05 0532) Pulse Rate: 109 (05/05 0532)  Labs:  Recent Labs  07/10/15 1635 07/11/15 0200 07/11/15 0510 07/11/15 0839 07/11/15 1407 07/12/15 0515  HGB 9.5*  --  7.3*  --   --  7.5*  HCT 29.8*  --  23.2*  --   --  24.5*  PLT 604*  --  500*  --   --  492*  CREATININE 0.90 0.64  --   --   --   --   TROPONINI <0.03 <0.03  --  <0.03 <0.03  --     Estimated Creatinine Clearance: 63.8 mL/min (by C-G formula based on Cr of 0.64).   Assessment: 42 yoF with metastatic lung cancer on chemotherapy at [facility] admitted with possible sepsis and acute PE.  CT angiogram of the chest done showed 2 areas of pulmonary embolism.  Patient's oncologist recommended that patient be placed on Lovenox, started 5/3.  Pharmacy consulted to dose.  Patient transferred to [facility] 5/4.  Patient currently on Lovenox 55 mg (1 mg/kg) SQ q12h.   Renal function stable.  CrCl>30 ml/min. CBC shows anemia, leukocytosis, thrombocytosis.  Per care everywhere, patient's WBC and Plts have been persistently elevated.  Hgb trended down since admission.  No bleeding reported.   Goal of Therapy:  Anti-Xa level 0.6-1 units/ml 4hrs after LMWH dose given Monitor platelets by anticoagulation protocol: Yes   Plan:  Continue Lovenox 1 mg/kg SQ q12h. Monitor CBC at least q72h.  Will check CBC tomorrow morning since Hgb low.  Hershal Coria 07/12/2015,8:13 AM

## 2015-07-12 NOTE — Progress Notes (Signed)
PROGRESS NOTE  Gina Dean HER:[id number] DOB: 07/15/1955 DOA: 07/10/2015 PCP: Idamae Schuller, MD  HPI/Recap of past 24 hours:  Reported feeling better, on room air, denies chest pain, still intermittent nonproductive cough, reported at baseline, intermittent mild fever, reported she has been having these intermittent fever for the last month. Husband in room  Assessment/Plan: Principal Problem:   Pulmonary embolism without acute cor pulmonale (HCC) Active Problems:   SIRS (systemic inflammatory response syndrome) (HCC)   Chronic anemia   PE (pulmonary embolism)   Non-small cell lung cancer (NSCLC) ([place])  #1. Pulmonary embolism - ER physician had discussed with patient's oncologist at [facility] who has advised patient to be placed on Lovenox which will be continued. Patient presently is hemodynamically stable. Negative  cardiac markers 2-D echo did not show right hear strain #2. SIRS with lactic acidosis - source not clear. Suspect could be respiratory given patient's productive cough. Patient was admitted last month for postobstructive pneumonia but CT chest does not show any definite evidence of pneumonia at this time. patient is empirically placed on vancomycin and Zosyn from admission, vanc stopped due after negative mrsa screen s/p hydration,  lactic acid levels normalized,  blood cultures no growth #3. Leukocytosis and thrombocytosis - this has been persistently elevated after reviewing patient's labs and care everywhere. For now we are treating empirically on antibiotics secondary to #2. #4. Chronic anemia - follow CBC. #5. History of hypertension - patient states she has not been taking antihypertensives for more than a year now since her blood pressure has been running low. #6. History of asthma - presently not wheezing continue home inhalers. #7, intermittent low grade fever, patient reported she has been having this for at least a month, she reported taking tylenol at home and  her oncology is aware of it, fever could be from cancer.   Code Status: full  Family Communication: patient and husband in room  Disposition Plan:  Home on 5/6 with home health RN for medication supervision and may need home o2, need to ambulate patient and check o2 level.    Consultants:  EDP called patient's oncologist at [facility]  Procedures:  CTA  Antibiotics:  vanc/zosyn   Objective: BP 112/69 mmHg  Pulse 109  Temp(Src) 100.8 F (38.2 C) (Oral)  Resp 20  Ht '5\' 4"'$  (1.626 m)  Wt 58.1 kg (128 lb 1.4 oz)  BMI 21.98 kg/m2  SpO2 94%  Intake/Output Summary (Last 24 hours) at 07/12/15 1159 Last data filed at 07/12/15 0536  Gross per 24 hour  Intake    250 ml  Output    500 ml  Net   -250 ml   Filed Weights   07/10/15 1656 07/10/15 1801 07/12/15 0532  Weight: 57.153 kg (126 lb) 57.153 kg (126 lb) 58.1 kg (128 lb 1.4 oz)    Exam:   General:  Hard of hearing, Frail, but NAD  Cardiovascular: RRR, intermittent sinus tachycardia  Respiratory: decreased on left upper lung fields,CTABL  Abdomen: Soft/ND/NT, positive BS  Musculoskeletal: No Edema  Neuro: aaox3,   Data Reviewed: Basic Metabolic Panel:  Recent Labs Lab 07/10/15 1635 07/11/15 0200 07/12/15 0823  NA 136 140 140  K 3.2* 3.4* 3.5  CL 102 113* 116*  CO2 21* 20* 16*  GLUCOSE 158* 112* 79  BUN 6 <5* <5*  CREATININE 0.90 0.64 0.60  CALCIUM 10.0 8.6* 8.7*   Liver Function Tests:  Recent Labs Lab 07/10/15 1635 07/11/15 0200  AST 24 8*  ALT 14 12*  ALKPHOS 138* 104  BILITOT 0.9 0.6  PROT 7.5 6.3*  ALBUMIN 2.4* 2.0*   No results for input(s): LIPASE, AMYLASE in the last 168 hours. No results for input(s): AMMONIA in the last 168 hours. CBC:  Recent Labs Lab 07/10/15 1635 07/11/15 0510 07/12/15 0515  WBC 57.2* 53.0* 50.6*  NEUTROABS 45.7* 44.6*  --   HGB 9.5* 7.3* 7.5*  HCT 29.8* 23.2* 24.5*  MCV 84.7 83.5 86.6  PLT 604* 500* 492*   Cardiac  Enzymes:    Recent Labs Lab 07/10/15 1635 07/11/15 0200 07/11/15 0839 07/11/15 1407  TROPONINI <0.03 <0.03 <0.03 <0.03   BNP (last 3 results)  Recent Labs  07/10/15 1635  BNP 40.9    ProBNP (last 3 results) No results for input(s): PROBNP in the last 8760 hours.  CBG: No results for input(s): GLUCAP in the last 168 hours.  Recent Results (from the past 240 hour(s))  Blood Culture (routine x 2)     Status: None (Preliminary result)   Collection Time: 07/10/15  4:50 PM  Result Value Ref Range Status   Specimen Description BLOOD RIGHT ARM  Final   Special Requests BOTTLES DRAWN AEROBIC AND ANAEROBIC 5CC EACH  Final   Culture   Final    NO GROWTH < 24 HOURS Performed at [facility]    Report Status PENDING  Incomplete  Blood Culture (routine x 2)     Status: None (Preliminary result)   Collection Time: 07/10/15  5:00 PM  Result Value Ref Range Status   Specimen Description BLOOD RIGHT ARM  Final   Special Requests BOTTLES DRAWN AEROBIC AND ANAEROBIC 5CC EACH  Final   Culture   Final    NO GROWTH < 24 HOURS Performed at [facility]    Report Status PENDING  Incomplete  Urine culture     Status: None (Preliminary result)   Collection Time: 07/10/15  5:53 PM  Result Value Ref Range Status   Specimen Description URINE, CLEAN CATCH  Final   Special Requests NONE  Final   Culture   Final    NO GROWTH < 12 HOURS Performed at [facility]    Report Status PENDING  Incomplete  MRSA PCR Screening     Status: None   Collection Time: 07/11/15 12:58 AM  Result Value Ref Range Status   MRSA by PCR NEGATIVE NEGATIVE Final    Comment:        The GeneXpert MRSA Assay (FDA approved for NASAL specimens only), is one component of a comprehensive MRSA colonization surveillance program. It is not intended to diagnose MRSA infection nor to guide or monitor treatment for MRSA infections.      Studies: No results found.  Scheduled Meds: .  enoxaparin (LOVENOX) injection  1 mg/kg Subcutaneous Q12H  . guaiFENesin  600 mg Oral BID  . loratadine  10 mg Oral Daily  . mometasone-formoterol  2 puff Inhalation BID  . piperacillin-tazobactam (ZOSYN)  IV  3.375 g Intravenous Q8H  . potassium chloride  40 mEq Oral Once    Continuous Infusions:    Time spent: 64mns  Janelle Culton MD, PhD  [facility] Pager [phone] If 7PM-7AM, please contact night-coverage at [email or web address], password [email or web address]5/07/2015, 11:59 AM  LOS: 1 day

## 2015-07-12 NOTE — Progress Notes (Signed)
Initial Nutrition Assessment  DOCUMENTATION CODES:   Not applicable  INTERVENTION:  -Ensure Enlive po BID, each supplement provides 350 kcal and 20 grams of protein -RD continue to monitor  NUTRITION DIAGNOSIS:   Inadequate oral intake related to poor appetite as evidenced by per patient/family report.  GOAL:   Patient will meet greater than or equal to 90% of their needs  MONITOR:   PO intake, Supplement acceptance, Labs, I & O's, Skin  REASON FOR ASSESSMENT:   Malnutrition Screening Tool    ASSESSMENT:   Gina Dean is a 60 y.o. female with medical history significant of metastatic lung cancer on chemotherapy, hypertension to see all medications, asthma presents with ER because of shortness of breath. Patient has been having shortness of breath over the last 2 days on exertion and sometimes at rest. Denies any chest pain. Patient has been having chronic cough and was admitted last month at [facility] for postobstructive pneumonia. In the ER patient initially was found to be hypotensive tachycardic and lactic as it was elevated and was given empiric antibiotics for sepsis after blood cultures were sent  Gina Dean is a pleasant 60 yo lady who presents with multiple PE's and SIRS. She states PTA her appetite was poor for approximately 1 week. During this time span she endorses a 20# wt loss, no evidence available of said wt loss in chart. PO intake by patient during stay has been ok. She endorses eating "at least half of the food on trays." This morning she had half a sausage patty, orange juice, and half of the eggs she was provided. Appetite is still returning. She denies taste changes. Denies chewing/swallowing problems and nausea/vomiting. She does like ensure, will provide BID.  Continues to be febrile (100.8)  Labs reviewed. Medications reviewed.  Diet Order:  Diet regular Room service appropriate?: Yes; Fluid consistency:: Thin  Skin:  Reviewed, no  issues  Last BM:  5/4  Height:   Ht Readings from Last 1 Encounters:  07/10/15 '5\' 4"'$  (1.626 m)    Weight:   Wt Readings from Last 1 Encounters:  07/12/15 128 lb 1.4 oz (58.1 kg)    Ideal Body Weight:  54.54 kg  BMI:  Body mass index is 21.98 kg/(m^2).  Estimated Nutritional Needs:   Kcal:  1700-2000 (30-35 cal/kg)  Protein:  70-90 grams (1.2-1.5 g/kg)  Fluid:  >/= 1.7L  EDUCATION NEEDS:   No education needs identified at this time  Gina Dean. Gina Rostro, MS, RD LDN After Hours/Weekend Pager [phone]

## 2015-07-13 DIAGNOSIS — R509 Fever, unspecified: Secondary | ICD-10-CM

## 2015-07-13 LAB — BASIC METABOLIC PANEL
Anion gap: 11 (ref 5–15)
BUN: <5 mg/dL — ABNORMAL LOW (ref 6–20)
CALCIUM: 9.3 mg/dL (ref 8.9–10.3)
CHLORIDE: 111 mmol/L (ref 101–111)
CO2: 15 mmol/L — AB (ref 22–32)
CREATININE: 0.77 mg/dL (ref 0.44–1.00)
GFR calc non Af Amer: >60 mL/min (ref >60)
GLUCOSE: 71 mg/dL (ref 65–99)
Potassium: 4.1 mmol/L (ref 3.5–5.1)
Sodium: 137 mmol/L (ref 135–145)

## 2015-07-13 LAB — MAGNESIUM: Magnesium: 1.4 mg/dL — ABNORMAL LOW (ref 1.7–2.4)

## 2015-07-13 LAB — CBC
HEMATOCRIT: 24.6 % — AB (ref 36.0–46.0)
HEMOGLOBIN: 7.8 g/dL — AB (ref 12.0–15.0)
MCH: 26.2 pg (ref 26.0–34.0)
MCHC: 31.7 g/dL (ref 30.0–36.0)
MCV: 82.6 fL (ref 78.0–100.0)
Platelets: 450 10*3/uL — ABNORMAL HIGH (ref 150–400)
RBC: 2.98 MIL/uL — ABNORMAL LOW (ref 3.87–5.11)
RDW: 15.7 % — AB (ref 11.5–15.5)
WBC: 48.6 10*3/uL — ABNORMAL HIGH (ref 4.0–10.5)

## 2015-07-13 LAB — TSH: TSH: 1.062 u[IU]/mL (ref 0.350–4.500)

## 2015-07-13 MED ORDER — TRAMADOL HCL 50 MG PO TABS
50.0000 mg | ORAL_TABLET | Freq: Four times a day (QID) | ORAL | Status: DC | PRN
  Administered 2015-07-13 (×2): 50 mg via ORAL
  Filled 2015-07-13 (×2): qty 1

## 2015-07-13 MED ORDER — MAGNESIUM SULFATE 2 GM/50ML IV SOLN
2.0000 g | Freq: Once | INTRAVENOUS | Status: AC
  Administered 2015-07-13: 2 g via INTRAVENOUS
  Filled 2015-07-13: qty 50

## 2015-07-13 MED ORDER — AMOXICILLIN-POT CLAVULANATE 875-125 MG PO TABS
1.0000 | ORAL_TABLET | Freq: Two times a day (BID) | ORAL | Status: DC
  Administered 2015-07-13 – 2015-07-14 (×2): 1 via ORAL
  Filled 2015-07-13 (×2): qty 1

## 2015-07-13 NOTE — Progress Notes (Signed)
PROGRESS NOTE  Gina Dean IPJ:[id number] DOB: Sep 13, 1955 DOA: 07/10/2015 PCP: Idamae Schuller, MD  HPI/Recap of past 24 hours:  Reported feeling better, on room air, denies chest pain, still intermittent nonproductive cough, reported at baseline,  Still have intermittent mild fever, reported she has been having these intermittent fever for the last month. Husband and daughter confirmed these information in room  Assessment/Plan: Principal Problem:   Pulmonary embolism without acute cor pulmonale (HCC) Active Problems:   SIRS (systemic inflammatory response syndrome) (HCC)   Chronic anemia   PE (pulmonary embolism)   Non-small cell lung cancer (NSCLC) ([employer])  #1. Pulmonary embolism - ER physician had discussed with patient's oncologist at [facility] who has advised patient to be placed on Lovenox which will be continued. Patient presently is hemodynamically stable. Negative  cardiac markers 2-D echo did not show right hear strain  #2. SIRS with lactic acidosis, fever - source not clear. Suspect could be respiratory given patient's productive cough. Patient was admitted last month for postobstructive pneumonia but CT chest does not show any definite evidence of pneumonia at this time. patient is empirically placed on vancomycin and Zosyn from admission, vanc stopped due after negative mrsa screen s/p hydration,  lactic acid levels normalized,  blood cultures no growth Patient kept spiking fever on vanc/zosyn, all culture no growth, patient does not look septic, I have talked to [facility] oncology on call Dr. Dewitt Hoes Porosnicu who recommended switch abx to augmentin and observe, may be discharge home on augmentin if stable in the next 24hrs, otherwise may need to be transferred to [place] [facility], per Dr Melven Sartorius the fever and chronic significant leukocytosis likely caused  By chronic infection inside the very large sized lung cancer. Family understand the plan of care and  agrees to it.  #3. Leukocytosis and thrombocytosis - this has been persistently elevated after reviewing patient's labs and care everywhere. For now we are treating empirically on antibiotics secondary to #2.  #4. Chronic anemia - follow CBC.  #5. History of hypertension - patient states she has not been taking antihypertensives for more than a year now since her blood pressure has been running low. #6. History of asthma - presently not wheezing continue home inhalers.   Code Status: full, confirmed with patient  Family Communication: patient, her husband and daughter  in room  Disposition Plan:  Pending, may need to be transferred to [facility] [place] if keep spiking fever, Dr. Dewitt Hoes Porosnicu on call on 5/7 as well.   Consultants:  EDP called patient's oncologist at [facility] on 5/4  I have talked to oncologist Dr Melven Sartorius on 5/6  Procedures:  CTA  Antibiotics:  Vanc/zosyn from admission to 5/6 augmentin from 5/6 per Dr. Dewitt Hoes Porosnicu recommendation    Objective: BP 100/62 mmHg  Pulse 114  Temp(Src) 98.6 F (37 C) (Oral)  Resp 18  Ht '5\' 4"'$  (1.626 m)  Wt 57.6 kg (126 lb 15.8 oz)  BMI 21.79 kg/m2  SpO2 95%  Intake/Output Summary (Last 24 hours) at 07/13/15 1216 Last data filed at 07/13/15 0700  Gross per 24 hour  Intake    150 ml  Output    450 ml  Net   -300 ml   Filed Weights   07/10/15 1801 07/12/15 0532 07/13/15 0533  Weight: 57.153 kg (126 lb) 58.1 kg (128 lb 1.4 oz) 57.6 kg (126 lb 15.8 oz)    Exam:   General:  Hard of hearing, Frail, but NAD  Cardiovascular: RRR, intermittent sinus tachycardia  Respiratory: decreased on left upper lung fields, mild crackles right lower lung fields  Abdomen: Soft/ND/NT, positive BS  Musculoskeletal: No Edema  Neuro: aaox3,   Data Reviewed: Basic Metabolic Panel:  Recent Labs Lab 07/10/15 1635 07/11/15 0200 07/12/15 0823 07/13/15 0503  NA 136 140  140 137  K 3.2* 3.4* 3.5 4.1  CL 102 113* 116* 111  CO2 21* 20* 16* 15*  GLUCOSE 158* 112* 79 71  BUN 6 <5* <5* <5*  CREATININE 0.90 0.64 0.60 0.77  CALCIUM 10.0 8.6* 8.7* 9.3  MG  --   --   --  1.4*   Liver Function Tests:  Recent Labs Lab 07/10/15 1635 07/11/15 0200  AST 24 8*  ALT 14 12*  ALKPHOS 138* 104  BILITOT 0.9 0.6  PROT 7.5 6.3*  ALBUMIN 2.4* 2.0*   No results for input(s): LIPASE, AMYLASE in the last 168 hours. No results for input(s): AMMONIA in the last 168 hours. CBC:  Recent Labs Lab 07/10/15 1635 07/11/15 0510 07/12/15 0515 07/13/15 0503  WBC 57.2* 53.0* 50.6* 48.6*  NEUTROABS 45.7* 44.6*  --   --   HGB 9.5* 7.3* 7.5* 7.8*  HCT 29.8* 23.2* 24.5* 24.6*  MCV 84.7 83.5 86.6 82.6  PLT 604* 500* 492* 450*   Cardiac Enzymes:    Recent Labs Lab 07/10/15 1635 07/11/15 0200 07/11/15 0839 07/11/15 1407  TROPONINI <0.03 <0.03 <0.03 <0.03   BNP (last 3 results)  Recent Labs  07/10/15 1635  BNP 40.9    ProBNP (last 3 results) No results for input(s): PROBNP in the last 8760 hours.  CBG: No results for input(s): GLUCAP in the last 168 hours.  Recent Results (from the past 240 hour(s))  Blood Culture (routine x 2)     Status: None (Preliminary result)   Collection Time: 07/10/15  4:50 PM  Result Value Ref Range Status   Specimen Description BLOOD RIGHT ARM  Final   Special Requests BOTTLES DRAWN AEROBIC AND ANAEROBIC 5CC EACH  Final   Culture   Final    NO GROWTH 2 DAYS Performed at [facility]    Report Status PENDING  Incomplete  Blood Culture (routine x 2)     Status: None (Preliminary result)   Collection Time: 07/10/15  5:00 PM  Result Value Ref Range Status   Specimen Description BLOOD RIGHT ARM  Final   Special Requests BOTTLES DRAWN AEROBIC AND ANAEROBIC 5CC EACH  Final   Culture   Final    NO GROWTH 2 DAYS Performed at [facility]    Report Status PENDING  Incomplete  Urine culture     Status: Abnormal    Collection Time: 07/10/15  5:53 PM  Result Value Ref Range Status   Specimen Description URINE, CLEAN CATCH  Final   Special Requests NONE  Final   Culture MULTIPLE SPECIES PRESENT, SUGGEST RECOLLECTION (A)  Final   Report Status 07/12/2015 FINAL  Final  MRSA PCR Screening     Status: None   Collection Time: 07/11/15 12:58 AM  Result Value Ref Range Status   MRSA by PCR NEGATIVE NEGATIVE Final    Comment:        The GeneXpert MRSA Assay (FDA approved for NASAL specimens only), is one component of a comprehensive MRSA colonization surveillance program. It is not intended to diagnose MRSA infection nor to guide or monitor treatment for MRSA infections.      Studies: No results found.  Scheduled Meds: . amoxicillin-clavulanate  1 tablet Oral Q12H  . enoxaparin (LOVENOX) injection  1 mg/kg Subcutaneous Q12H  . feeding supplement (ENSURE ENLIVE)  237 mL Oral BID BM  . guaiFENesin  600 mg Oral BID  . loratadine  10 mg Oral Daily  . mometasone-formoterol  2 puff Inhalation BID    Continuous Infusions:    Time spent: 57mns  Kelleen Stolze MD, PhD  [facility] Pager [phone] If 7PM-7AM, please contact night-coverage at [email or web address], password [email or web address]5/08/2015, 12:16 PM  LOS: 2 days

## 2015-07-13 NOTE — Progress Notes (Signed)
Patient o2 saturations sustained in the high 90's during walk.  HR sustained  In the 120's-130's.  MD made aware.

## 2015-07-14 ENCOUNTER — Inpatient Hospital Stay (HOSPITAL_COMMUNITY): Payer: Medicare Other

## 2015-07-14 DIAGNOSIS — D649 Anemia, unspecified: Secondary | ICD-10-CM

## 2015-07-14 LAB — CBC
HEMATOCRIT: 21.1 % — AB (ref 36.0–46.0)
HEMOGLOBIN: 6.9 g/dL — AB (ref 12.0–15.0)
MCH: 26.3 pg (ref 26.0–34.0)
MCHC: 32.7 g/dL (ref 30.0–36.0)
MCV: 80.5 fL (ref 78.0–100.0)
Platelets: 498 10*3/uL — ABNORMAL HIGH (ref 150–400)
RBC: 2.62 MIL/uL — ABNORMAL LOW (ref 3.87–5.11)
RDW: 15.8 % — AB (ref 11.5–15.5)
WBC: 38 10*3/uL — AB (ref 4.0–10.5)

## 2015-07-14 LAB — BASIC METABOLIC PANEL
ANION GAP: 8 (ref 5–15)
BUN: <5 mg/dL — ABNORMAL LOW (ref 6–20)
CALCIUM: 8.9 mg/dL (ref 8.9–10.3)
CO2: 17 mmol/L — AB (ref 22–32)
CREATININE: 0.6 mg/dL (ref 0.44–1.00)
Chloride: 109 mmol/L (ref 101–111)
Glucose, Bld: 88 mg/dL (ref 65–99)
Potassium: 3.5 mmol/L (ref 3.5–5.1)
SODIUM: 134 mmol/L — AB (ref 135–145)

## 2015-07-14 LAB — PREPARE RBC (CROSSMATCH)

## 2015-07-14 LAB — MAGNESIUM: MAGNESIUM: 1.6 mg/dL — AB (ref 1.7–2.4)

## 2015-07-14 MED ORDER — SODIUM CHLORIDE 0.9 % IV BOLUS (SEPSIS)
500.0000 mL | Freq: Once | INTRAVENOUS | Status: AC
Start: 2015-07-14 — End: 2015-07-14
  Administered 2015-07-14: 500 mL via INTRAVENOUS

## 2015-07-14 MED ORDER — SODIUM CHLORIDE 0.9 % IV SOLN: Freq: Once | INTRAVENOUS | Status: DC

## 2015-07-14 MED ORDER — ALUM & MAG HYDROXIDE-SIMETH 200-200-20 MG/5ML PO SUSP
30.0000 mL | ORAL | Status: DC | PRN
  Administered 2015-07-14 (×2): 30 mL via ORAL
  Filled 2015-07-14 (×2): qty 30

## 2015-07-14 MED ORDER — MAGNESIUM SULFATE 2 GM/50ML IV SOLN
2.0000 g | Freq: Once | INTRAVENOUS | Status: AC
  Administered 2015-07-14: 2 g via INTRAVENOUS
  Filled 2015-07-14: qty 50

## 2015-07-14 MED ORDER — ALUM & MAG HYDROXIDE-SIMETH 200-200-20 MG/5ML PO SUSP: 15.0000 mL | Freq: Once | ORAL | Status: DC

## 2015-07-14 MED ORDER — FAMOTIDINE 20 MG PO TABS
20.0000 mg | ORAL_TABLET | Freq: Two times a day (BID) | ORAL | Status: DC
  Administered 2015-07-14: 20 mg via ORAL
  Filled 2015-07-14: qty 1

## 2015-07-14 NOTE — Discharge Summary (Signed)
Discharge Summary  Gina Dean JSE:[id number] DOB: 1955/11/14  PCP: Idamae Schuller, MD  Admit date: 07/10/2015 Discharge date: 07/14/2015  Time spent: >53mns  [facility]. Accepting MD Dr Dr. MDewitt HoesPorosnicu   Discharge Diagnoses:  Active Hospital Problems   Diagnosis Date Noted  . Pulmonary embolism without acute cor pulmonale ([facility] 07/11/2015  . SIRS (systemic inflammatory response syndrome) ([facility] 07/11/2015  . Chronic anemia 07/11/2015  . PE (pulmonary embolism) 07/11/2015  . Non-small cell lung cancer (NSCLC) ([facility]     Resolved Hospital Problems   Diagnosis Date Noted Date Resolved  No resolved problems to display.    Discharge Condition: stable  Diet recommendation: regular diet  Filed Weights   07/12/15 0532 07/13/15 0533 07/14/15 0530  Weight: 58.1 kg (128 lb 1.4 oz) 57.6 kg (126 lb 15.8 oz) 57.4 kg (126 lb 8.7 oz)    History of present illness:  Chief Complaint: Shortness of breath.  HPI: Gina Scarlettis a 60y.o. female with medical history significant of metastatic lung cancer on chemotherapy, hypertension to see all medications, asthma presents with ER because of shortness of breath. Patient has been having shortness of breath over the last 2 days on exertion and sometimes at rest. Denies any chest pain. Patient has been having chronic cough [facility] In the ER patient initially was found to be hypotensive tachycardic and lactic as it was elevated and was given empiric antibiotics for sepsis after blood cultures were sent. CT angiogram of the chest done showed 2 areas of pulmonary embolism. [facility] and discuss with patient's oncologist by the time is advised patient to be placed on Lovenox and since there was no bed available  [facility] Patient's blood pressure and tachycardia has significantly improved after fluid bolus. On exam patient is able to talk without any difficulty. Patient has been still having productive cough. Denies any nausea vomiting abdominal pain and diarrhea.   ED Course: Patient was given fluid bolus antibiotics and Lovenox for possible sepsis and PE.  Hospital Course:  Principal Problem:   Pulmonary embolism without acute cor pulmonale (HCC) Active Problems:   SIRS (systemic inflammatory response syndrome) (HCC)   Chronic anemia   PE (pulmonary embolism)   Non-small cell lung cancer (NSCLC) ([facility]  #1. Pulmonary embolism - ER physician [facility] be [facility] on Lovenox which will be continued.  Negative cardiac markers, 2-D echo did not show right hear strain. Drop of hgb while no overt sign of bleed, will transfuse and continue lovenox, transfer [facility].  #2. SIRS with lactic acidosis, fever - source not clear. Suspect could be respiratory given patient's productive cough. Patient was admitted last month for postobstructive pneumonia but CT chest does not show any definite evidence of pneumonia at this time. patient is empirically placed on vancomycin and Zosyn from admission, vanc stopped due after negative mrsa screen s/p hydration, lactic acid levels normalized, blood cultures no growth Patient kept spiking fever on vanc/zosyn, all culture no growth, patient does not look septic, [facility] Dr. MDewitt HoesPorosnicu who recommended switch abx to augmentin and observe, may be discharge home on augmentin if stable in the next 24hrs, otherwise may need to be  transferred to [place] center, per Dr Melven Sartorius the fever and chronic significant leukocytosis likely caused By chronic infection inside the very large sized lung cancer.  Family understand the plan of care and agrees to it. Condition remain tenuous, discussed with Dr Melven Sartorius, patient will be transferred to [facility] oncology service.  #3. Leukocytosis and thrombocytosis - wbc range from 38 to 57. Platelet range from 450 to 604,  this has been persistently elevated after reviewing patient's labs and care everywhere. For now we are treating empirically on antibiotics secondary to #2.  #4. Chronic anemia - hgb dropping with borderline bp, no overt sign of external bleed, prbc transfusion. CT ab/pel without contrast negative for retroperitoneal bleed.  #5. History of hypertension - patient states she has not been taking antihypertensives for more than a year now since her blood pressure has been running low.  #6. History of asthma - presently not wheezing continue home inhalers.   Code Status: full, confirmed with patient  Family Communication: patient, her husband  in room  Disposition Plan:  transfer to [facility] [place] [facility] due to patient kept spiking fever, borderline blood pressure,acute on chronic anemia, significant leukocytosis, PE and metastatic lung cancer.  Accepting MD  Dr. Dewitt Hoes Porosnicu    Consultants:  EDP called patient's oncologist at [place] [facility] on 5/4  I have talked to oncologist Dr Melven Sartorius on 5/6 and 5/7  Procedures:  CTA  Antibiotics:  Vanc/zosyn from admission to 5/6 augmentin from 5/6 per Dr. Dewitt Hoes Porosnicu recommendation   Discharge Exam: BP 102/56 mmHg  Pulse 90  Temp(Src) 98.4 F (36.9 C) (Oral)  Resp 20  Ht '5\' 4"'$  (1.626 m)  Wt 57.4 kg (126 lb 8.7 oz)  BMI 21.71 kg/m2  SpO2 100%   General: Hard of hearing, Frail, but NAD  Cardiovascular: RRR, intermittent sinus tachycardia  Respiratory: decreased on left upper lung fields, mild crackles right lower lung fields  Abdomen: Soft/ND/NT, positive BS  Musculoskeletal: No  Edema  Neuro: aaox3,   Discharge Instructions You were cared for by a hospitalist during your hospital stay. If you have any questions about your discharge medications or the care you received while you were in the hospital after you are discharged, you can call the unit and asked to speak with the hospitalist on call if the hospitalist that took care of you is not available. Once you are discharged, your primary care physician will handle any further medical issues. Please note that NO REFILLS for any discharge medications will be authorized once you are discharged, as it is imperative that you return to your primary care physician (or establish a relationship with a primary care physician if you do not have one) for your aftercare needs so that they can reassess your need for medications and monitor your lab values.  Discharge Instructions    Face-to-face encounter (required for Medicare/Medicaid patients)    Complete by:  As directed   I Philamena Kramar certify that this patient is under my care and that I, or a nurse practitioner or physician's assistant working with me, had a face-to-face encounter that meets the physician face-to-face encounter requirements with this patient on 07/12/2015. The encounter with the patient was in whole, or in part for the following medical condition(s) which is the primary reason for home health care (List medical condition): lung cancer/PE/FTT, medication supervision  The encounter with the patient was in whole, or in part, for the following medical condition,  which is the primary reason for home health care:  FTT  I certify that, based on my findings, the following services are medically necessary home health services:  Nursing  Reason for Medically Necessary Home Health Services:  Skilled Nursing- Change/Decline in Patient Status  My clinical findings support the need for the above services:  Shortness of breath with activity  Further, I certify that my clinical findings  support that this patient is homebound due to:  Shortness of Breath with activity     Home Health    Complete by:  As directed   To provide the following care/treatments:  RN            Medication List    TAKE these medications        albuterol (2.5 MG/3ML) 0.083% nebulizer solution  Commonly known as:  PROVENTIL  Take 2.5 mg by nebulization every 4 (four) hours as needed for wheezing or shortness of breath.     benzonatate 100 MG capsule  Commonly known as:  TESSALON  Take 100 mg by mouth every 8 (eight) hours as needed for cough.     BREO ELLIPTA 100-25 MCG/INH Aepb  Generic drug:  fluticasone furoate-vilanterol  Inhale 1 puff into the lungs daily.     enoxaparin 150 MG/ML injection  Commonly known as:  LOVENOX  Inject 0.38 mLs (55 mg total) into the skin every 12 (twelve) hours.       No Known Allergies    The results of significant diagnostics from this hospitalization (including imaging, microbiology, ancillary and laboratory) are listed below for reference.    Significant Diagnostic Studies: Ct Angio Chest Pe W/cm &/or Wo Cm  07/10/2015  CLINICAL DATA:  03/08/2014 PET-CT EXAM: CT ANGIOGRAPHY CHEST WITH CONTRAST TECHNIQUE: Multidetector CT imaging of the chest was performed using the standard protocol during bolus administration of intravenous contrast. Multiplanar CT image reconstructions and MIPs were obtained to evaluate the vascular anatomy. CONTRAST:  100 mL Isovue 370 COMPARISON:  02/26/2014 PET-CT FINDINGS: Mediastinum/Lymph Nodes: Small pulmonary emboli are visualized within a central posterior right upper lobe bronchus as well as the distal right lower lobe pulmonary artery and segmental pulmonary arteries. No evidence of thoracic aortic aneurysm or dissection. RV/LV ratio measures 1.0, indicating possible right heart strain. Mild mediastinal lymphadenopathy is seen within the prevascular space, AP window, right paratracheal and lateral aortic regions, and  subcarinal region. Index lymph node in subcarinal region measures 1.3 cm on image 44 series 5. Lungs/Pleura: A large heterogeneously enhancing mass with central necrosis is seen in the left upper lobe which encases the left pulmonary artery and also appears to involve meet the mediastinum. This also abuts the chest wall soft tissues in the left upper lobe and left lung apex. This mass measures 10 x 11 cm, compared to 4.2 x 5.1 cm previously. Mild infiltrate is seen in the posterior right lower lobe peripheral to right lower lobe pulmonary arterial emboli, consistent with pulmonary infarct. Tiny left pleural effusion noted. Upper abdomen: Benign hemangioma is again seen in the right hepatic lobe which is stable as well as partial visualization of a calcified gallstone. Musculoskeletal: No chest wall mass or suspicious bone lesions identified. Review of the MIP images confirms the above findings. IMPRESSION: Mild acute pulmonary emboli within right upper and lower lobe pulmonary arteries. Right lower lobe pulmonary infarct also noted. CT evidence of right heart strain (RV/LV Ratio = 1.0) can be a sign of submassive (intermediate risk) PE, although the amount of  right lung pulmonary embolism in this case does not appear sub massive and the right heart strain could be due to involvement of the left pulmonary artery by the large left upper lobe mass. The presence of right heart strain has been associated with an increased risk of morbidity and mortality. Consider activating Code PE by paging [phone]. 11 cm left upper lobe mass, with mediastinal and hilar involvement, which significant enlarged compared to prior PET-CT in 2015. This mass also abuts the chest wall soft tissues, without obvious direct invasion. Tiny left pleural effusion noted. Mild mediastinal lymphadenopathy, consistent with metastatic disease. Critical Value/emergent results were called by telephone at the time of interpretation on 07/10/2015 at 6:52  pm to Dr. Sherwood Gambler , who verbally acknowledged these results. Electronically Signed   By: Earle Gell M.D.   On: 07/10/2015 19:01   Dg Chest Portable 1 View  07/10/2015  CLINICAL DATA:  Shortness of breath for 2 days. History of lung cancer with brain metastases. History of asthma. EXAM: PORTABLE CHEST 1 VIEW COMPARISON:  04/17/2015 FINDINGS: Suspected increase in size of known consolidative heterogeneous mass within in the left upper lung, now with obscuration of the aortic arch and superior aspect the left hilum. No definitive osseous erosion. Otherwise unchanged cardiac silhouette and mediastinal contours. The right lung remains well aerated. No pleural effusion or pneumothorax. No evidence of edema. No acute osseus abnormalities. IMPRESSION: Increase in size of known consolidative/ heterogeneous mass within the left upper lung worrisome for progression of disease. Electronically Signed   By: Sandi Mariscal M.D.   On: 07/10/2015 17:19    Microbiology: Recent Results (from the past 240 hour(s))  Blood Culture (routine x 2)     Status: None (Preliminary result)   Collection Time: 07/10/15  4:50 PM  Result Value Ref Range Status   Specimen Description BLOOD RIGHT ARM  Final   Special Requests BOTTLES DRAWN AEROBIC AND ANAEROBIC 5CC EACH  Final   Culture   Final    NO GROWTH 3 DAYS Performed at [facility]    Report Status PENDING  Incomplete  Blood Culture (routine x 2)     Status: None (Preliminary result)   Collection Time: 07/10/15  5:00 PM  Result Value Ref Range Status   Specimen Description BLOOD RIGHT ARM  Final   Special Requests BOTTLES DRAWN AEROBIC AND ANAEROBIC 5CC EACH  Final   Culture   Final    NO GROWTH 3 DAYS Performed at [facility]    Report Status PENDING  Incomplete  Urine culture     Status: Abnormal   Collection Time: 07/10/15  5:53 PM  Result Value Ref Range Status   Specimen Description URINE, CLEAN CATCH  Final   Special Requests NONE  Final    Culture MULTIPLE SPECIES PRESENT, SUGGEST RECOLLECTION (A)  Final   Report Status 07/12/2015 FINAL  Final  MRSA PCR Screening     Status: None   Collection Time: 07/11/15 12:58 AM  Result Value Ref Range Status   MRSA by PCR NEGATIVE NEGATIVE Final    Comment:        The GeneXpert MRSA Assay (FDA approved for NASAL specimens only), is one component of a comprehensive MRSA colonization surveillance program. It is not intended to diagnose MRSA infection nor to guide or monitor treatment for MRSA infections.      Labs: Basic Metabolic Panel:  Recent Labs Lab 07/10/15 1635 07/11/15 0200 07/12/15 0823 07/13/15 0503 07/14/15 0451  NA  136 140 140 137 134*  K 3.2* 3.4* 3.5 4.1 3.5  CL 102 113* 116* 111 109  CO2 21* 20* 16* 15* 17*  GLUCOSE 158* 112* 79 71 88  BUN 6 <5* <5* <5* <5*  CREATININE 0.90 0.64 0.60 0.77 0.60  CALCIUM 10.0 8.6* 8.7* 9.3 8.9  MG  --   --   --  1.4* 1.6*   Liver Function Tests:  Recent Labs Lab 07/10/15 1635 07/11/15 0200  AST 24 8*  ALT 14 12*  ALKPHOS 138* 104  BILITOT 0.9 0.6  PROT 7.5 6.3*  ALBUMIN 2.4* 2.0*   No results for input(s): LIPASE, AMYLASE in the last 168 hours. No results for input(s): AMMONIA in the last 168 hours. CBC:  Recent Labs Lab 07/10/15 1635 07/11/15 0510 07/12/15 0515 07/13/15 0503 07/14/15 0451  WBC 57.2* 53.0* 50.6* 48.6* 38.0*  NEUTROABS 45.7* 44.6*  --   --   --   HGB 9.5* 7.3* 7.5* 7.8* 6.9*  HCT 29.8* 23.2* 24.5* 24.6* 21.1*  MCV 84.7 83.5 86.6 82.6 80.5  PLT 604* 500* 492* 450* 498*   Cardiac Enzymes:  Recent Labs Lab 07/10/15 1635 07/11/15 0200 07/11/15 0839 07/11/15 1407  TROPONINI <0.03 <0.03 <0.03 <0.03   BNP: BNP (last 3 results)  Recent Labs  07/10/15 1635  BNP 40.9    ProBNP (last 3 results) No results for input(s): PROBNP in the last 8760 hours.  CBG: No results for input(s): GLUCAP in the last 168 hours.     SignedFlorencia Reasons MD, PhD  [facility] 07/14/2015, 12:54 PM

## 2015-07-14 NOTE — Progress Notes (Signed)
Pt left arm swollen, NP on call paged. No new orders given. Will continue to monitor patient.

## 2015-07-14 NOTE — Progress Notes (Signed)
Report called to [facility] and talked with Denny Peon RN

## 2015-07-14 NOTE — Progress Notes (Addendum)
CRITICAL VALUE ALERT  Critical value received:  Hemoglobin 6.9  Date of notification:  07/14/15  Time of notification:  1660  Critical value read back:Yes.    Nurse who received alert:  Janell Quiet  MD notified (1st page):  Fredirick Maudlin  Time of first page:  [phone]  MD notified (2nd page):  Time of second page:  Responding MD:  Fredirick Maudlin  Time MD responded:  [phone]

## 2015-07-15 LAB — CULTURE, BLOOD (ROUTINE X 2)
Culture: NO GROWTH
Culture: NO GROWTH

## 2015-07-15 LAB — TYPE AND SCREEN
ABO/RH(D): A POS
ANTIBODY SCREEN: NEGATIVE
Unit division: 0

## 2016-10-15 IMAGING — DX DG CHEST 1V PORT
1 series · 1 of 1 positions shown · non-contrast
Comparison: 04/17/2015

CLINICAL DATA: Shortness of breath for 2 days. History of lung
cancer with brain metastases. History of asthma.

EXAM:
PORTABLE CHEST 1 VIEW

[chest ap]
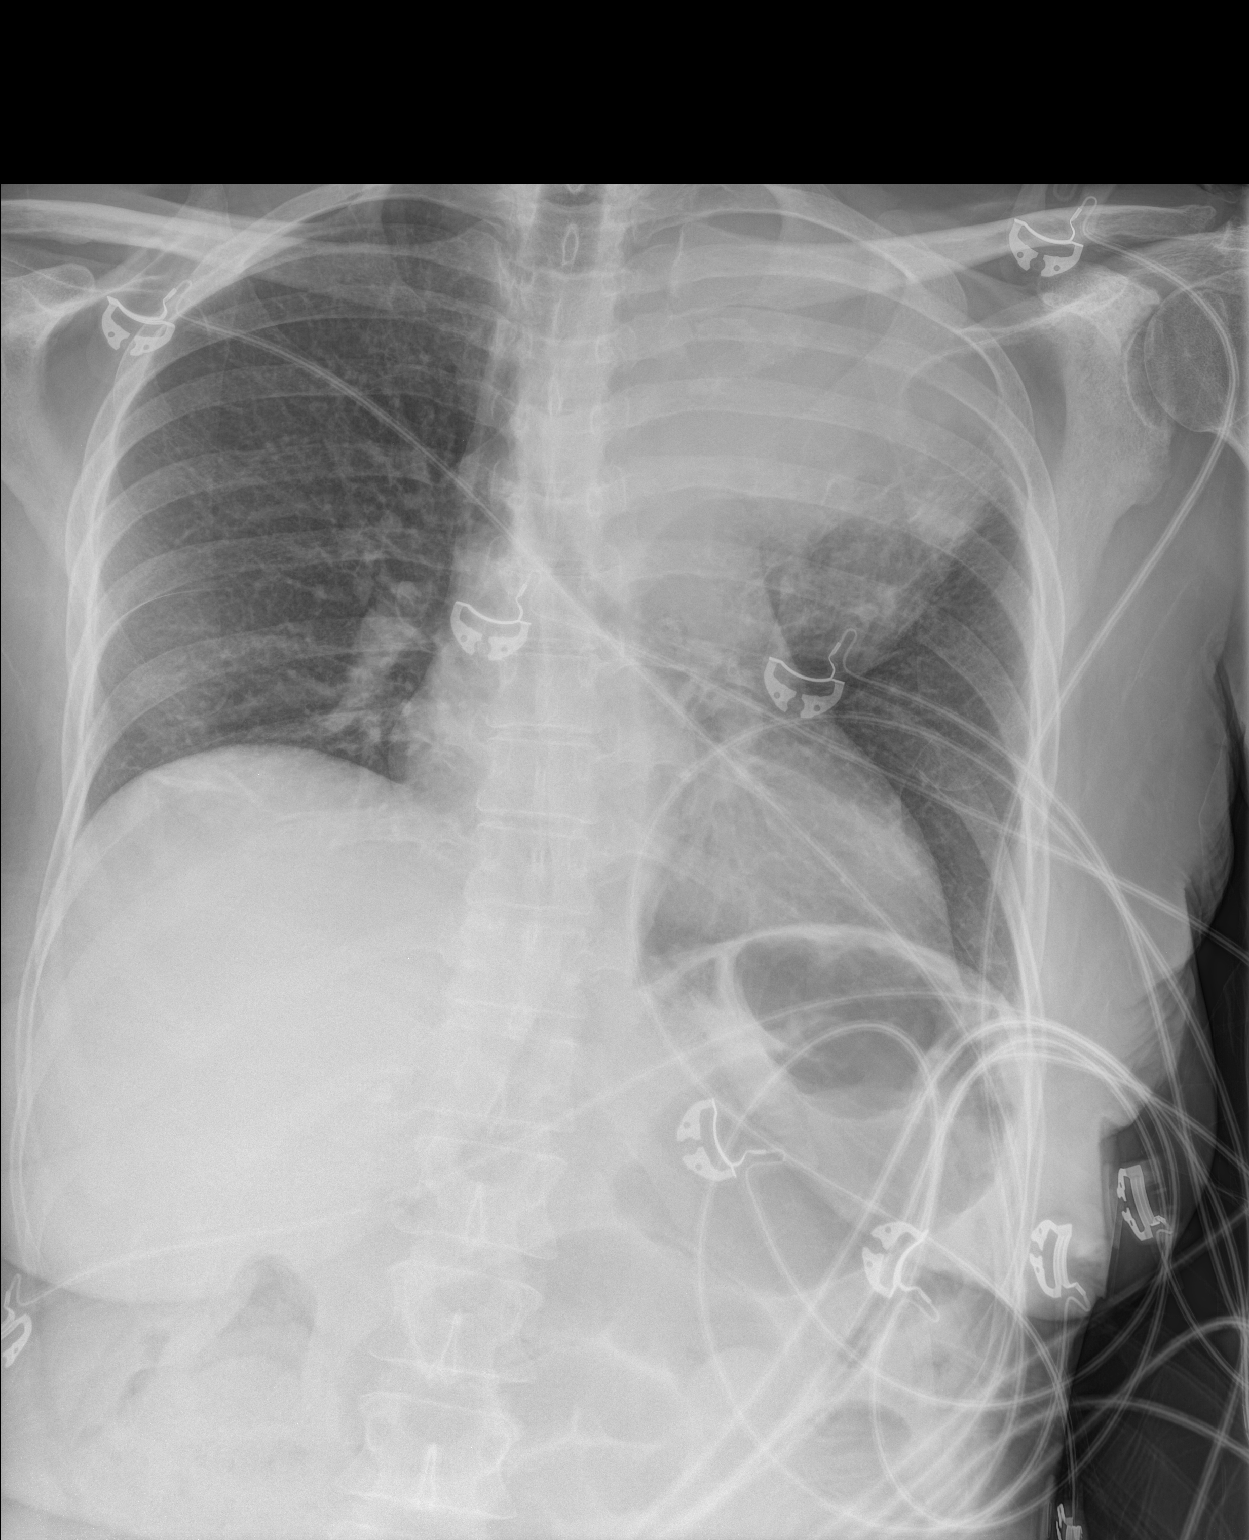

[1 of 1 positions shown; findings below may reference images not displayed]

FINDINGS: Suspected increase in size of known consolidative heterogeneous mass
within in the left upper lung, now with obscuration of the aortic
arch and superior aspect the left hilum. No definitive osseous
erosion.

Otherwise unchanged cardiac silhouette and mediastinal contours. The
right lung remains well aerated. No pleural effusion or
pneumothorax. No evidence of edema. No acute osseus abnormalities.
IMPRESSION: Increase in size of known consolidative/ heterogeneous mass within
the left upper lung worrisome for progression of disease.
